# Patient Record
Sex: Female | Born: 1960 | Race: Black or African American | Hispanic: No | Smoking: Former smoker
Health system: Southern US, Community
[De-identification: ages and names within clinical notes are randomized; demographics above are authoritative.]

## PROBLEM LIST (undated history)

## (undated) ENCOUNTER — Ambulatory Visit (HOSPITAL_COMMUNITY): Admission: EM | Payer: Medicaid Other | Source: Home / Self Care

## (undated) DIAGNOSIS — F32A Depression, unspecified: Secondary | ICD-10-CM

## (undated) DIAGNOSIS — F329 Major depressive disorder, single episode, unspecified: Secondary | ICD-10-CM

## (undated) DIAGNOSIS — F419 Anxiety disorder, unspecified: Secondary | ICD-10-CM

## (undated) DIAGNOSIS — F319 Bipolar disorder, unspecified: Secondary | ICD-10-CM

## (undated) DIAGNOSIS — F209 Schizophrenia, unspecified: Secondary | ICD-10-CM

## (undated) HISTORY — PX: HERNIA REPAIR: SHX51

---

## 1998-01-12 ENCOUNTER — Encounter: Admission: RE | Admit: 1998-01-12 | Discharge: 1998-01-12 | Payer: Self-pay | Admitting: Family Medicine

## 1998-01-26 ENCOUNTER — Encounter: Admission: RE | Admit: 1998-01-26 | Discharge: 1998-01-26 | Payer: Self-pay | Admitting: Family Medicine

## 2001-02-16 ENCOUNTER — Encounter: Admission: RE | Admit: 2001-02-16 | Discharge: 2001-02-16 | Payer: Self-pay | Admitting: Family Medicine

## 2001-03-30 ENCOUNTER — Encounter: Admission: RE | Admit: 2001-03-30 | Discharge: 2001-03-30 | Payer: Self-pay | Admitting: Family Medicine

## 2001-12-07 ENCOUNTER — Encounter (INDEPENDENT_AMBULATORY_CARE_PROVIDER_SITE_OTHER): Payer: Self-pay | Admitting: *Deleted

## 2001-12-07 LAB — CONVERTED CEMR LAB

## 2002-01-03 ENCOUNTER — Encounter: Admission: RE | Admit: 2002-01-03 | Discharge: 2002-01-03 | Payer: Self-pay | Admitting: Family Medicine

## 2002-01-03 ENCOUNTER — Other Ambulatory Visit: Admission: RE | Admit: 2002-01-03 | Discharge: 2002-01-03 | Payer: Self-pay | Admitting: Family Medicine

## 2002-01-10 ENCOUNTER — Encounter: Admission: RE | Admit: 2002-01-10 | Discharge: 2002-01-10 | Payer: Self-pay | Admitting: Family Medicine

## 2006-08-07 ENCOUNTER — Encounter (INDEPENDENT_AMBULATORY_CARE_PROVIDER_SITE_OTHER): Payer: Self-pay | Admitting: *Deleted

## 2012-04-09 ENCOUNTER — Ambulatory Visit (INDEPENDENT_AMBULATORY_CARE_PROVIDER_SITE_OTHER): Payer: Self-pay | Admitting: Family Medicine

## 2012-04-09 ENCOUNTER — Encounter: Payer: Self-pay | Admitting: Family Medicine

## 2012-04-09 VITALS — BP 122/76 | HR 69 | Ht 60.0 in | Wt 157.0 lb

## 2012-04-09 DIAGNOSIS — M170 Bilateral primary osteoarthritis of knee: Secondary | ICD-10-CM | POA: Insufficient documentation

## 2012-04-09 DIAGNOSIS — M545 Low back pain: Secondary | ICD-10-CM | POA: Insufficient documentation

## 2012-04-09 DIAGNOSIS — M25569 Pain in unspecified knee: Secondary | ICD-10-CM

## 2012-04-09 LAB — BASIC METABOLIC PANEL
BUN: 15 mg/dL (ref 6–23)
Calcium: 9.5 mg/dL (ref 8.4–10.5)
Chloride: 105 mEq/L (ref 96–112)
Creat: 0.95 mg/dL (ref 0.50–1.10)

## 2012-04-09 MED ORDER — MELOXICAM 15 MG PO TABS: 15.0000 mg | ORAL_TABLET | Freq: Every day | ORAL | Status: DC

## 2012-04-09 NOTE — Assessment & Plan Note (Addendum)
Mobic printed per pt request. Check BMET to ensure adequate renal function since no labs in the system for this patient.  Would get xrays if pt qualifies for medicaid or orange card. Likely this is arthritis. No abnormalities on exam; diffuse tenderness more c/w something like fibromyalgia. Would want xray before injecting.  Could also consider referral to [facility] for U/S.

## 2012-04-09 NOTE — Patient Instructions (Addendum)
It was nice to meet you today.  I am starting a medicine to help with the pain.  We are checking a lab to make sure you kidneys are ok to start this medicine.  CALL AND MAKE AN APPOINTMENT TO SEE BARBARA ABOUT THE ORANGE CARD.  Come back to get re-established with your new PCP.  Make sure you bring ALL medicine bottles with you to that appointment.  It would also be helpful to have your mental health doctor send [place] your most recent office visit.    Low Back Sprain with Rehab  A sprain is an injury in which a ligament is torn. The ligaments of the lower back are vulnerable to sprains. However, they are strong and require great force to be injured. These ligaments are important for stabilizing the spinal column. Sprains are classified into three categories. Grade 1 sprains cause pain, but the tendon is not lengthened. Grade 2 sprains include a lengthened ligament, due to the ligament being stretched or partially ruptured. With grade 2 sprains there is still function, although the function may be decreased. Grade 3 sprains involve a complete tear of the tendon or muscle, and function is usually impaired. SYMPTOMS   Severe pain in the lower back.  Sometimes, a feeling of a "pop," "snap," or tear, at the time of injury.  Tenderness and sometimes swelling at the injury site.  Uncommonly, bruising (contusion) within 48 hours of injury.  Muscle spasms in the back. CAUSES  Low back sprains occur when a force is placed on the ligaments that is greater than they can handle. Common causes of injury include:  Performing a stressful act while off-balance.  Repetitive stressful activities that involve movement of the lower back.  Direct hit (trauma) to the lower back. RISK INCREASES WITH:  Contact sports (football, wrestling).  Collisions (major skiing accidents).  Sports that require throwing or lifting (baseball, weightlifting).  Sports involving twisting of the spine (gymnastics, diving,  tennis, golf).  Poor strength and flexibility.  Inadequate protection.  Previous back injury or surgery (especially fusion). PREVENTION  Wear properly fitted and padded protective equipment.  Warm up and stretch properly before activity.  Allow for adequate recovery between workouts.  Maintain physical fitness:  Strength, flexibility, and endurance.  Cardiovascular fitness.  Maintain a healthy body weight. PROGNOSIS  If treated properly, low back sprains usually heal with non-surgical treatment. The length of time for healing depends on the severity of the injury.  RELATED COMPLICATIONS   Recurring symptoms, resulting in a chronic problem.  Chronic inflammation and pain in the low back.  Delayed healing or resolution of symptoms, especially if activity is resumed too soon.  Prolonged impairment.  Unstable or arthritic joints of the low back. TREATMENT  Treatment first involves the use of ice and medicine, to reduce pain and inflammation. The use of strengthening and stretching exercises may help reduce pain with activity. These exercises may be performed at home or with a therapist. Severe injuries may require referral to a therapist for further evaluation and treatment, such as ultrasound. Your caregiver may advise that you wear a back brace or corset, to help reduce pain and discomfort. Often, prolonged bed rest results in greater harm then benefit. Corticosteroid injections may be recommended. However, these should be reserved for the most serious cases. It is important to avoid using your back when lifting objects. At night, sleep on your back on a firm mattress, with a pillow placed under your knees. If non-surgical treatment is  unsuccessful, surgery may be needed.  MEDICATION   If pain medicine is needed, nonsteroidal anti-inflammatory medicines (aspirin and ibuprofen), or other minor pain relievers (acetaminophen), are often advised.  Do not take pain medicine for 7  days before surgery.  Prescription pain relievers may be given, if your caregiver thinks they are needed. Use only as directed and only as much as you need.  Ointments applied to the skin may be helpful.  Corticosteroid injections may be given by your caregiver. These injections should be reserved for the most serious cases, because they may only be given a certain number of times. HEAT AND COLD  Cold treatment (icing) should be applied for 10 to 15 minutes every 2 to 3 hours for inflammation and pain, and immediately after activity that aggravates your symptoms. Use ice packs or an ice massage.  Heat treatment may be used before performing stretching and strengthening activities prescribed by your caregiver, physical therapist, or athletic trainer. Use a heat pack or a warm water soak. SEEK MEDICAL CARE IF:   Symptoms get worse or do not improve in 2 to 4 weeks, despite treatment.  You develop numbness or weakness in either leg.  You lose bowel or bladder function.  Any of the following occur after surgery: fever, increased pain, swelling, redness, drainage of fluids, or bleeding in the affected area.  New, unexplained symptoms develop. (Drugs used in treatment may produce side effects.) EXERCISES  RANGE OF MOTION (ROM) AND STRETCHING EXERCISES - Low Back Sprain Most people with lower back pain will find that their symptoms get worse with excessive bending forward (flexion) or arching at the lower back (extension). The exercises that will help resolve your symptoms will focus on the opposite motion.  Your physician, physical therapist or athletic trainer will help you determine which exercises will be most helpful to resolve your lower back pain. Do not complete any exercises without first consulting with your caregiver. Discontinue any exercises which make your symptoms worse, until you speak to your caregiver. If you have pain, numbness or tingling which travels down into your buttocks,  leg or foot, the goal of the therapy is for these symptoms to move closer to your back and eventually resolve. Sometimes, these leg symptoms will get better, but your lower back pain may worsen. This is often an indication of progress in your rehabilitation. Be very alert to any changes in your symptoms and the activities in which you participated in the 24 hours prior to the change. Sharing this information with your caregiver will allow him or her to most efficiently treat your condition. These exercises may help you when beginning to rehabilitate your injury. Your symptoms may resolve with or without further involvement from your physician, physical therapist or athletic trainer. While completing these exercises, remember:   Restoring tissue flexibility helps normal motion to return to the joints. This allows healthier, less painful movement and activity.  An effective stretch should be held for at least 30 seconds.  A stretch should never be painful. You should only feel a gentle lengthening or release in the stretched tissue. FLEXION RANGE OF MOTION AND STRETCHING EXERCISES: STRETCH  Flexion, Single Knee to Chest   Lie on a firm bed or floor with both legs extended in front of you.  Keeping one leg in contact with the floor, bring your opposite knee to your chest. Hold your leg in place by either grabbing behind your thigh or at your knee.  Pull until you feel a  gentle stretch in your low back. Hold __________ seconds.  Slowly release your grasp and repeat the exercise with the opposite side. Repeat __________ times. Complete this exercise __________ times per day.  STRETCH  Flexion, Double Knee to Chest  Lie on a firm bed or floor with both legs extended in front of you.  Keeping one leg in contact with the floor, bring your opposite knee to your chest.  Tense your stomach muscles to support your back and then lift your other knee to your chest. Hold your legs in place by either  grabbing behind your thighs or at your knees.  Pull both knees toward your chest until you feel a gentle stretch in your low back. Hold __________ seconds.  Tense your stomach muscles and slowly return one leg at a time to the floor. Repeat __________ times. Complete this exercise __________ times per day.  STRETCH  Low Trunk Rotation  Lie on a firm bed or floor. Keeping your legs in front of you, bend your knees so they are both pointed toward the ceiling and your feet are flat on the floor.  Extend your arms out to the side. This will stabilize your upper body by keeping your shoulders in contact with the floor.  Gently and slowly drop both knees together to one side until you feel a gentle stretch in your low back. Hold for __________ seconds.  Tense your stomach muscles to support your lower back as you bring your knees back to the starting position. Repeat the exercise to the other side. Repeat __________ times. Complete this exercise __________ times per day  EXTENSION RANGE OF MOTION AND FLEXIBILITY EXERCISES: STRETCH  Extension, Prone on Elbows   Lie on your stomach on the floor, a bed will be too soft. Place your palms about shoulder width apart and at the height of your head.  Place your elbows under your shoulders. If this is too painful, stack pillows under your chest.  Allow your body to relax so that your hips drop lower and make contact more completely with the floor.  Hold this position for __________ seconds.  Slowly return to lying flat on the floor. Repeat __________ times. Complete this exercise __________ times per day.  RANGE OF MOTION  Extension, Prone Press Ups  Lie on your stomach on the floor, a bed will be too soft. Place your palms about shoulder width apart and at the height of your head.  Keeping your back as relaxed as possible, slowly straighten your elbows while keeping your hips on the floor. You may adjust the placement of your hands to maximize  your comfort. As you gain motion, your hands will come more underneath your shoulders.  Hold this position __________ seconds.  Slowly return to lying flat on the floor. Repeat __________ times. Complete this exercise __________ times per day.  RANGE OF MOTION- Quadruped, Neutral Spine   Assume a hands and knees position on a firm surface. Keep your hands under your shoulders and your knees under your hips. You may place padding under your knees for comfort.  Drop your head and point your tailbone toward the ground below you. This will round out your lower back like an angry cat. Hold this position for __________ seconds.  Slowly lift your head and release your tail bone so that your back sags into a large arch, like an old horse.  Hold this position for __________ seconds.  Repeat this until you feel limber in your low back.  Now, find your "sweet spot." This will be the most comfortable position somewhere between the two previous positions. This is your neutral spine. Once you have found this position, tense your stomach muscles to support your low back.  Hold this position for __________ seconds. Repeat __________ times. Complete this exercise __________ times per day.  STRENGTHENING EXERCISES - Low Back Sprain These exercises may help you when beginning to rehabilitate your injury. These exercises should be done near your "sweet spot." This is the neutral, low-back arch, somewhere between fully rounded and fully arched, that is your least painful position. When performed in this safe range of motion, these exercises can be used for people who have either a flexion or extension based injury. These exercises may resolve your symptoms with or without further involvement from your physician, physical therapist or athletic trainer. While completing these exercises, remember:   Muscles can gain both the endurance and the strength needed for everyday activities through controlled  exercises.  Complete these exercises as instructed by your physician, physical therapist or athletic trainer. Increase the resistance and repetitions only as guided.  You may experience muscle soreness or fatigue, but the pain or discomfort you are trying to eliminate should never worsen during these exercises. If this pain does worsen, stop and make certain you are following the directions exactly. If the pain is still present after adjustments, discontinue the exercise until you can discuss the trouble with your caregiver. STRENGTHENING Deep Abdominals, Pelvic Tilt   Lie on a firm bed or floor. Keeping your legs in front of you, bend your knees so they are both pointed toward the ceiling and your feet are flat on the floor.  Tense your lower abdominal muscles to press your low back into the floor. This motion will rotate your pelvis so that your tail bone is scooping upwards rather than pointing at your feet or into the floor. With a gentle tension and even breathing, hold this position for __________ seconds. Repeat __________ times. Complete this exercise __________ times per day.  STRENGTHENING  Abdominals, Crunches   Lie on a firm bed or floor. Keeping your legs in front of you, bend your knees so they are both pointed toward the ceiling and your feet are flat on the floor. Cross your arms over your chest.  Slightly tip your chin down without bending your neck.  Tense your abdominals and slowly lift your trunk high enough to just clear your shoulder blades. Lifting higher can put excessive stress on the lower back and does not further strengthen your abdominal muscles.  Control your return to the starting position. Repeat __________ times. Complete this exercise __________ times per day.  STRENGTHENING  Quadruped, Opposite UE/LE Lift   Assume a hands and knees position on a firm surface. Keep your hands under your shoulders and your knees under your hips. You may place padding under  your knees for comfort.  Find your neutral spine and gently tense your abdominal muscles so that you can maintain this position. Your shoulders and hips should form a rectangle that is parallel with the floor and is not twisted.  Keeping your trunk steady, lift your right hand no higher than your shoulder and then your left leg no higher than your hip. Make sure you are not holding your breath. Hold this position for __________ seconds.  Continuing to keep your abdominal muscles tense and your back steady, slowly return to your starting position. Repeat with the opposite arm and leg. Repeat  __________ times. Complete this exercise __________ times per day.  STRENGTHENING  Abdominals and Quadriceps, Straight Leg Raise   Lie on a firm bed or floor with both legs extended in front of you.  Keeping one leg in contact with the floor, bend the other knee so that your foot can rest flat on the floor.  Find your neutral spine, and tense your abdominal muscles to maintain your spinal position throughout the exercise.  Slowly lift your straight leg off the floor about 6 inches for a count of 15, making sure to not hold your breath.  Still keeping your neutral spine, slowly lower your leg all the way to the floor. Repeat this exercise with each leg __________ times. Complete this exercise __________ times per day. POSTURE AND BODY MECHANICS CONSIDERATIONS - Low Back Sprain Keeping correct posture when sitting, standing or completing your activities will reduce the stress put on different body tissues, allowing injured tissues a chance to heal and limiting painful experiences. The following are general guidelines for improved posture. Your physician or physical therapist will provide you with any instructions specific to your needs. While reading these guidelines, remember:  The exercises prescribed by your provider will help you have the flexibility and strength to maintain correct postures.  The  correct posture provides the best environment for your joints to work. All of your joints have less wear and tear when properly supported by a spine with good posture. This means you will experience a healthier, less painful body.  Correct posture must be practiced with all of your activities, especially prolonged sitting and standing. Correct posture is as important when doing repetitive low-stress activities (typing) as it is when doing a single heavy-load activity (lifting). RESTING POSITIONS Consider which positions are most painful for you when choosing a resting position. If you have pain with flexion-based activities (sitting, bending, stooping, squatting), choose a position that allows you to rest in a less flexed posture. You would want to avoid curling into a fetal position on your side. If your pain worsens with extension-based activities (prolonged standing, working overhead), avoid resting in an extended position such as sleeping on your stomach. Most people will find more comfort when they rest with their spine in a more neutral position, neither too rounded nor too arched. Lying on a non-sagging bed on your side with a pillow between your knees, or on your back with a pillow under your knees will often provide some relief. Keep in mind, being in any one position for a prolonged period of time, no matter how correct your posture, can still lead to stiffness. PROPER SITTING POSTURE In order to minimize stress and discomfort on your spine, you must sit with correct posture. Sitting with good posture should be effortless for a healthy body. Returning to good posture is a gradual process. Many people can work toward this most comfortably by using various supports until they have the flexibility and strength to maintain this posture on their own. When sitting with proper posture, your ears will fall over your shoulders and your shoulders will fall over your hips. You should use the back of the chair  to support your upper back. Your lower back will be in a neutral position, just slightly arched. You may place a small pillow or folded towel at the base of your lower back for  support.  When working at a desk, create an environment that supports good, upright posture. Without extra support, muscles tire, which leads to excessive  strain on joints and other tissues. Keep these recommendations in mind: CHAIR:  A chair should be able to slide under your desk when your back makes contact with the back of the chair. This allows you to work closely.  The chair's height should allow your eyes to be level with the upper part of your monitor and your hands to be slightly lower than your elbows. BODY POSITION  Your feet should make contact with the floor. If this is not possible, use a foot rest.  Keep your ears over your shoulders. This will reduce stress on your neck and low back. INCORRECT SITTING POSTURES  If you are feeling tired and unable to assume a healthy sitting posture, do not slouch or slump. This puts excessive strain on your back tissues, causing more damage and pain. Healthier options include:  Using more support, like a lumbar pillow.  Switching tasks to something that requires you to be upright or walking.  Talking a brief walk.  Lying down to rest in a neutral-spine position. PROLONGED STANDING WHILE SLIGHTLY LEANING FORWARD  When completing a task that requires you to lean forward while standing in one place for a long time, place either foot up on a stationary 2-4 inch high object to help maintain the best posture. When both feet are on the ground, the lower back tends to lose its slight inward curve. If this curve flattens (or becomes too large), then the back and your other joints will experience too much stress, tire more quickly, and can cause pain. CORRECT STANDING POSTURES Proper standing posture should be assumed with all daily activities, even if they only take a few  moments, like when brushing your teeth. As in sitting, your ears should fall over your shoulders and your shoulders should fall over your hips. You should keep a slight tension in your abdominal muscles to brace your spine. Your tailbone should point down to the ground, not behind your body, resulting in an over-extended swayback posture.  INCORRECT STANDING POSTURES  Common incorrect standing postures include a forward head, locked knees and/or an excessive swayback. WALKING Walk with an upright posture. Your ears, shoulders and hips should all line-up. PROLONGED ACTIVITY IN A FLEXED POSITION When completing a task that requires you to bend forward at your waist or lean over a low surface, try to find a way to stabilize 3 out of 4 of your limbs. You can place a hand or elbow on your thigh or rest a knee on the surface you are reaching across. This will provide you more stability, so that your muscles do not tire as quickly. By keeping your knees relaxed, or slightly bent, you will also reduce stress across your lower back. CORRECT LIFTING TECHNIQUES DO :  Assume a wide stance. This will provide you more stability and the opportunity to get as close as possible to the object which you are lifting.  Tense your abdominals to brace your spine. Bend at the knees and hips. Keeping your back locked in a neutral-spine position, lift using your leg muscles. Lift with your legs, keeping your back straight.  Test the weight of unknown objects before attempting to lift them.  Try to keep your elbows locked down at your sides in order get the best strength from your shoulders when carrying an object.  Always ask for help when lifting heavy or awkward objects. INCORRECT LIFTING TECHNIQUES DO NOT:   Lock your knees when lifting, even if it is a small object.  Bend and twist. Pivot at your feet or move your feet when needing to change directions.  Assume that you can safely pick up even a paperclip  without proper posture. Document Released: 05/26/2005 Document Revised: 08/18/2011 Document Reviewed: 09/07/2008 [facility] Patient Information 2013 [place], [place].
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
## 2012-04-09 NOTE — Assessment & Plan Note (Signed)
Mobic, strengthening exercises. Consider PT if pt qualifies for medicaid or orange card. No red flags or neuro symptoms on history or exam.

## 2012-04-11 ENCOUNTER — Encounter: Payer: Self-pay | Admitting: Family Medicine

## 2012-05-25 ENCOUNTER — Emergency Department (HOSPITAL_COMMUNITY)
Admission: EM | Admit: 2012-05-25 | Discharge: 2012-05-25 | Disposition: A | Discharge status: Home / Self Care | Payer: Self-pay | Attending: Emergency Medicine | Admitting: Emergency Medicine

## 2012-05-25 ENCOUNTER — Encounter (HOSPITAL_COMMUNITY): Payer: Self-pay | Admitting: *Deleted

## 2012-05-25 DIAGNOSIS — M545 Low back pain, unspecified: Secondary | ICD-10-CM | POA: Insufficient documentation

## 2012-05-25 DIAGNOSIS — F3289 Other specified depressive episodes: Secondary | ICD-10-CM | POA: Insufficient documentation

## 2012-05-25 DIAGNOSIS — Z8659 Personal history of other mental and behavioral disorders: Secondary | ICD-10-CM | POA: Insufficient documentation

## 2012-05-25 DIAGNOSIS — Z79899 Other long term (current) drug therapy: Secondary | ICD-10-CM | POA: Insufficient documentation

## 2012-05-25 DIAGNOSIS — M549 Dorsalgia, unspecified: Secondary | ICD-10-CM

## 2012-05-25 DIAGNOSIS — F209 Schizophrenia, unspecified: Secondary | ICD-10-CM | POA: Insufficient documentation

## 2012-05-25 DIAGNOSIS — M25569 Pain in unspecified knee: Secondary | ICD-10-CM | POA: Insufficient documentation

## 2012-05-25 DIAGNOSIS — F329 Major depressive disorder, single episode, unspecified: Secondary | ICD-10-CM | POA: Insufficient documentation

## 2012-05-25 DIAGNOSIS — F319 Bipolar disorder, unspecified: Secondary | ICD-10-CM | POA: Insufficient documentation

## 2012-05-25 HISTORY — DX: Bipolar disorder, unspecified: F31.9

## 2012-05-25 HISTORY — DX: Anxiety disorder, unspecified: F41.9

## 2012-05-25 HISTORY — DX: Major depressive disorder, single episode, unspecified: F32.9

## 2012-05-25 HISTORY — DX: Depression, unspecified: F32.A

## 2012-05-25 HISTORY — DX: Schizophrenia, unspecified: F20.9

## 2012-05-25 MED ORDER — MELOXICAM 7.5 MG PO TABS: 15.0000 mg | ORAL_TABLET | Freq: Every day | ORAL | Status: DC

## 2012-05-25 NOTE — ED Notes (Signed)
Pt reports chronic back and bilateral knee pain. Is a pt with Family Practice. Ran out of her pain medicine and needs a refill.

## 2012-05-25 NOTE — ED Notes (Signed)
Called pt 3 times for a room in [facility], pt did not show up.

## 2012-05-25 NOTE — ED Notes (Signed)
Patient states she woke up today with lower back pain and bil knee pain. She states both legs are giving out on her.  She denies fall

## 2012-05-25 NOTE — ED Provider Notes (Signed)
History    This chart was scribed for Geoffery Lyons, MD, MD by Smitty Pluck, ED Scribe. The patient was seen in room TR07C and the patient's care was started at 4:20PM.   CSN: [id number]  Arrival date & time 05/25/12  1512      Chief Complaint  Patient presents with  . Back Pain  . Knee Pain    (Consider location/radiation/quality/duration/timing/severity/associated sxs/prior treatment) The history is provided by the patient. No language interpreter was used.   Ruth Gardner is a 51 y.o. female who presents to the Emergency Department complaining of chronic moderate back and moderate bilateral knee pain onset 09/2011 with symptoms worsening 1 day ago. Pt reports that symptoms have been constant since last night. Pt reports that she was seen at [facility] in November and was given meloxicam but she ran out of the medication 15 days ago. She states the meloxicam relieved pain. Pt denies dysuria, bowel incontinence, urinary incontinence, numbness, weakness, recent trauma, fall and any other symptoms.   Past Medical History  Diagnosis Date  . Depression   . Anxiety   . Bipolar 1 disorder   . Schizophrenia     Past Surgical History  Procedure Date  . Hernia repair     No family history on file.  History  Substance Use Topics  . Smoking status: Never Smoker   . Smokeless tobacco: Not on file  . Alcohol Use: No    OB History    Grav Para Term Preterm Abortions TAB SAB Ect Mult Living                  Review of Systems  Constitutional: Negative for fever and chills.  Respiratory: Negative for shortness of breath.   Gastrointestinal: Negative for nausea and vomiting.  Musculoskeletal: Positive for back pain and arthralgias.  Neurological: Negative for weakness.  All other systems reviewed and are negative.    Allergies  Review of patient's allergies indicates no known allergies.  Home Medications   Current Outpatient Rx  Name  Route  Sig   Dispense  Refill  . FLUOXETINE HCL 20 MG PO CAPS   Oral   Take 40 mg by mouth daily.         . MELOXICAM 15 MG PO TABS   Oral   Take 1 tablet (15 mg total) by mouth daily.   30 tablet   1   . QUETIAPINE FUMARATE 100 MG PO TABS   Oral   Take 50 mg by mouth 2 (two) times daily.         Marland Kitchen RISPERIDONE 1 MG PO TABS   Oral   Take 1 mg by mouth at bedtime.         . TRAZODONE HCL 100 MG PO TABS   Oral   Take 100 mg by mouth at bedtime as needed. For insomnia           BP 111/67  Pulse 62  Temp 98.4 F (36.9 C) (Oral)  Resp 20  Ht 5' (1.524 m)  Wt 155 lb (70.308 kg)  BMI 30.27 kg/m2  SpO2 99%  Physical Exam  Nursing note and vitals reviewed. Constitutional: She is oriented to person, place, and time. She appears well-developed and well-nourished. No distress.  HENT:  Head: Normocephalic and atraumatic.  Eyes: EOM are normal.  Neck: Neck supple. No tracheal deviation present.  Cardiovascular: Normal rate.   Pulmonary/Chest: Effort normal. No respiratory distress.  Musculoskeletal: Normal range of  motion.       Tender to palpation in soft tissues of lumbar spine No bony tenderness No step off Pt is able to ambulate without difficulty    Neurological: She is alert and oriented to person, place, and time.       Strength 5/5  DTR +1 and equal   Skin: Skin is warm and dry.  Psychiatric: She has a normal mood and affect. Her behavior is normal.    ED Course  Procedures (including critical care time) DIAGNOSTIC STUDIES: Oxygen Saturation is 99% on room air, normal by my interpretation.    COORDINATION OF CARE: 4:25 PM Discussed ED treatment with pt     Labs Reviewed - No data to display No results found.   No diagnosis found.    MDM  Increasing pain since running out of meloxicam.  Will prescribe this for her.  Needs to see pcp for follow up.      I personally performed the services described in this documentation, which was scribed in my  presence. The recorded information has been reviewed and is accurate.      Geoffery Lyons, MD 05/25/12 [phone]

## 2014-01-02 ENCOUNTER — Other Ambulatory Visit: Payer: Self-pay | Admitting: Family Medicine

## 2014-01-02 DIAGNOSIS — Z1231 Encounter for screening mammogram for malignant neoplasm of breast: Secondary | ICD-10-CM

## 2014-01-06 ENCOUNTER — Ambulatory Visit (HOSPITAL_COMMUNITY)
Admission: RE | Admit: 2014-01-06 | Discharge: 2014-01-06 | Disposition: A | Discharge status: Home / Self Care | Payer: Medicaid Other | Source: Ambulatory Visit | Attending: Family Medicine | Admitting: Family Medicine

## 2014-01-06 ENCOUNTER — Ambulatory Visit (HOSPITAL_COMMUNITY): Payer: Self-pay

## 2014-01-06 DIAGNOSIS — Z1231 Encounter for screening mammogram for malignant neoplasm of breast: Secondary | ICD-10-CM | POA: Diagnosis present

## 2014-03-07 ENCOUNTER — Ambulatory Visit: Payer: Medicaid Other | Admitting: Family Medicine

## 2014-03-27 ENCOUNTER — Ambulatory Visit (INDEPENDENT_AMBULATORY_CARE_PROVIDER_SITE_OTHER): Payer: Medicaid Other | Admitting: Family Medicine

## 2014-03-27 ENCOUNTER — Ambulatory Visit (HOSPITAL_COMMUNITY)
Admission: RE | Admit: 2014-03-27 | Discharge: 2014-03-27 | Disposition: A | Discharge status: Home / Self Care | Payer: Medicaid Other | Source: Ambulatory Visit | Attending: Family Medicine | Admitting: Family Medicine

## 2014-03-27 ENCOUNTER — Other Ambulatory Visit: Payer: Self-pay | Admitting: Family Medicine

## 2014-03-27 ENCOUNTER — Encounter: Payer: Self-pay | Admitting: Family Medicine

## 2014-03-27 VITALS — BP 114/68 | HR 64 | Temp 97.8°F | Ht 64.0 in | Wt 154.3 lb

## 2014-03-27 DIAGNOSIS — G8929 Other chronic pain: Secondary | ICD-10-CM | POA: Insufficient documentation

## 2014-03-27 DIAGNOSIS — H538 Other visual disturbances: Secondary | ICD-10-CM

## 2014-03-27 DIAGNOSIS — M25562 Pain in left knee: Secondary | ICD-10-CM

## 2014-03-27 DIAGNOSIS — R079 Chest pain, unspecified: Secondary | ICD-10-CM

## 2014-03-27 DIAGNOSIS — M5442 Lumbago with sciatica, left side: Secondary | ICD-10-CM

## 2014-03-27 DIAGNOSIS — F39 Unspecified mood [affective] disorder: Secondary | ICD-10-CM | POA: Insufficient documentation

## 2014-03-27 LAB — CBC
HCT: 38 % (ref 36.0–46.0)
Hemoglobin: 12.7 g/dL (ref 12.0–15.0)
MCH: 27.9 pg (ref 26.0–34.0)
MCHC: 33.4 g/dL (ref 30.0–36.0)
MCV: 83.3 fL (ref 78.0–100.0)
Platelets: 367 10*3/uL (ref 150–400)
RBC: 4.56 MIL/uL (ref 3.87–5.11)
RDW: 13.8 % (ref 11.5–15.5)
WBC: 7.6 10*3/uL (ref 4.0–10.5)

## 2014-03-27 LAB — LIPID PANEL
Cholesterol: 226 mg/dL — ABNORMAL HIGH (ref 0–200)
HDL: 35 mg/dL — ABNORMAL LOW (ref >39)
LDL Cholesterol: 154 mg/dL — ABNORMAL HIGH (ref 0–99)
Total CHOL/HDL Ratio: 6.5 Ratio
Triglycerides: 187 mg/dL — ABNORMAL HIGH (ref <150)
VLDL: 37 mg/dL (ref 0–40)

## 2014-03-27 LAB — COMPREHENSIVE METABOLIC PANEL
ALBUMIN: 4.2 g/dL (ref 3.5–5.2)
ALK PHOS: 75 U/L (ref 39–117)
ALT: 11 U/L (ref 0–35)
AST: 16 U/L (ref 0–37)
BUN: 10 mg/dL (ref 6–23)
CHLORIDE: 105 meq/L (ref 96–112)
CO2: 27 mEq/L (ref 19–32)
Calcium: 9.6 mg/dL (ref 8.4–10.5)
Creat: 0.88 mg/dL (ref 0.50–1.10)
GLUCOSE: 88 mg/dL (ref 70–99)
Potassium: 4.6 mEq/L (ref 3.5–5.3)
Sodium: 140 mEq/L (ref 135–145)
Total Bilirubin: 0.3 mg/dL (ref 0.2–1.2)
Total Protein: 7 g/dL (ref 6.0–8.3)

## 2014-03-27 LAB — TSH: TSH: 1.541 u[IU]/mL (ref 0.350–4.500)

## 2014-03-27 MED ORDER — RISPERIDONE 2 MG PO TABS: 4.0000 mg | ORAL_TABLET | Freq: Every day | ORAL | Status: AC

## 2014-03-27 MED ORDER — NAPROXEN 375 MG PO TABS: 375.0000 mg | ORAL_TABLET | Freq: Two times a day (BID) | ORAL | Status: DC

## 2014-03-27 NOTE — Assessment & Plan Note (Signed)
Typical with atypical features None today Labs Refer to cards for stress test

## 2014-03-27 NOTE — Assessment & Plan Note (Signed)
Likely OA vs meniscus injury in the past Films NSAIDs Follow up 1 month

## 2014-03-27 NOTE — Assessment & Plan Note (Signed)
Labs, 20/40 BL with no red flags Encourage optometry

## 2014-03-27 NOTE — Assessment & Plan Note (Signed)
Next mood disorder [facility] On Risperdal, Prozac, trazodone Odd Affect today, denies visual or auditory hallucinations and suicidal ideation.

## 2014-03-27 NOTE — Patient Instructions (Signed)
Great to meet you  I have referred you to the heart doctors for your chest pain I will contact you about your labs  Chest Pain (Nonspecific) It is often hard to give a diagnosis for the cause of chest pain. There is always a chance that your pain could be related to something serious, such as a heart attack or a blood clot in the lungs. You need to follow up with your doctor. HOME CARE  If antibiotic medicine was given, take it as directed by your doctor. Finish the medicine even if you start to feel better.  For the next few days, avoid activities that bring on chest pain. Continue physical activities as told by your doctor.  Do not use any tobacco products. This includes cigarettes, chewing tobacco, and e-cigarettes.  Avoid drinking alcohol.  Only take medicine as told by your doctor.  Follow your doctor's suggestions for more testing if your chest pain does not go away.  Keep all doctor visits you made. GET HELP IF:  Your chest pain does not go away, even after treatment.  You have a rash with blisters on your chest.  You have a fever. GET HELP RIGHT AWAY IF:   You have more pain or pain that spreads to your arm, neck, jaw, back, or belly (abdomen).  You have shortness of breath.  You cough more than usual or cough up blood.  You have very bad back or belly pain.  You feel sick to your stomach (nauseous) or throw up (vomit).  You have very bad weakness.  You pass out (faint).  You have chills. This is an emergency. Do not wait to see if the problems will go away. Call your local emergency services (911 in [place] Do not drive yourself to the hospital. MAKE SURE YOU:   Understand these instructions.  Will watch your condition.  Will get help right away if you are not doing well or get worse. Document Released: 11/12/2007 Document Revised: 05/31/2013 Document Reviewed: 11/12/2007 [facility] Patient Information 2015 [employer] [place]. This information is not intended  to replace advice given to you by your health care provider. Make sure you discuss any questions you have with your health care provider.

## 2014-03-27 NOTE — Progress Notes (Signed)
Patient ID: Ruth Gardner, female   DOB: 04/29/1961, 53 y.o.   MRN: [id number]   HPI  Patient presents today for followup knee pain  Knee pain States that she's had progressive worsening bilateral knee pain, but left greater than right, for the last several years.  She denies any discrete injury and states that the pain is worse when walking or walking up stairs. She [facility]. She denies any swelling, erythema, or warmth of the knees  Chest pain She describes right-sided pressure-like chest pain worse with walking and improves with slowing down. She has associated dyspnea these times. She does sometimes have similar pain at rest. She states that the pain ranges from lasting several minutes to about 30 minutes. She denies any associated nausea or radiation.  Back pain States she has left-sided back pain with radiation down the back of her leg intermittently. She states that the knee pain is much worse than the back pain. As previously help with NSAIDs She denies bowel or bladder dysfunction, saddle anesthesia, or lower extremity weakness.  Smoking status noted ROS: Per HPI  Objective: BP 114/68  Pulse 64  Temp(Src) 97.8 F (36.6 C) (Oral)  Ht 5\' 4"  (1.626 m)  Wt 154 lb 4.8 oz (69.99 kg)  BMI 26.47 kg/m2 Gen: NAD, alert, cooperative with exam HEENT: NCAT, EOMI, injected conjunctiva, pupils reactive, no papilledema present, visual fields 20/40 bilateral on the Snellen eye chart CV: RRR, good S1/S2, no murmur Resp: CTABL, no wheezes, non-labored Ext: No edema, warm Neuro: Alert and oriented, No gross deficits MSK: L knee without erythema, effusion, bruising. Left patella with bony prominence at the superior lateral corner, positive patella grind Tenderness on bilateral joint lines ligamentously intact to Lachman's and with varus and valgus stress.  Psych: Odd affect, no SI, no visual or auditory hallucinations   Assessment and  plan:  Blurred vision, bilateral Labs, 20/40 BL with no red flags Encourage optometry  Low back pain No red flags, likely OA NSAIDs Follow up 1 month  Knee pain Likely OA vs meniscus injury in the past Films NSAIDs Follow up 1 month  Chest pain Typical with atypical features None today Labs Refer to cards for stress test  Mood disorder Next mood disorder managed by [facility] On Risperdal, Prozac, trazodone Odd Affect today, denies visual or auditory hallucinations and suicidal ideation.    Orders Placed This Encounter  Procedures  . DG Knee 4 Views W/Patella Left    Standing please    Standing Status: Future     Number of Occurrences:      Standing Expiration Date: 05/28/2015    Order Specific Question:  Reason for Exam (SYMPTOM  OR DIAGNOSIS REQUIRED)    Answer:  likely OA, satnding please    Order Specific Question:  Is the patient pregnant?    Answer:  No    Order Specific Question:  Preferred imaging location?    Answer:  [facility]  . TSH  . Comprehensive metabolic panel  . CBC  . Lipid panel  . Ambulatory referral to Cardiology    Referral Priority:  Routine    Referral Type:  Consultation    Referral Reason:  Specialty Services Required    Requested Specialty:  Cardiology    Number of Visits Requested:  1  . Ambulatory referral to Optometry    Referral Priority:  Routine    Referral Type:  Vision Training and development officer(Optometry)    Referral Reason:  Specialty Services Required  Requested Specialty:  Optometry    Number of Visits Requested:  1    Meds ordered this encounter  Medications  . naproxen (NAPROSYN) 375 MG tablet    Sig: Take 1 tablet (375 mg total) by mouth 2 (two) times daily with a meal.    Dispense:  40 tablet    Refill:  0

## 2014-03-27 NOTE — Assessment & Plan Note (Signed)
No red flags, likely OA NSAIDs Follow up 1 month

## 2014-03-28 ENCOUNTER — Encounter: Payer: Self-pay | Admitting: Family Medicine

## 2014-03-30 ENCOUNTER — Telehealth: Payer: Self-pay | Admitting: Family Medicine

## 2014-03-30 NOTE — Telephone Encounter (Signed)
Called to discuss labs and xray  Labs largely normal except elevated cholesterol. She has a 10 CVD risk of 5.9% placing her in the "consider mod intensity statin" group. Discussed diet changes and will discuss option of statin on follow up.   Knee film does not show severe arthritis.   Encouraged seeing optometry at a dept store for most affordability as medicaid doesn't cover it.   Murtis SinkSam Lenoir Facchini, MD [facility] Resident, PGY-3 03/30/2014, 8:15 AM

## 2014-04-25 ENCOUNTER — Encounter: Payer: Self-pay | Admitting: Internal Medicine

## 2014-04-25 ENCOUNTER — Ambulatory Visit (INDEPENDENT_AMBULATORY_CARE_PROVIDER_SITE_OTHER): Payer: Medicaid Other | Admitting: Internal Medicine

## 2014-04-25 VITALS — BP 113/78 | HR 65 | Ht 60.0 in | Wt 155.1 lb

## 2014-04-25 DIAGNOSIS — R5383 Other fatigue: Secondary | ICD-10-CM

## 2014-04-25 DIAGNOSIS — R0683 Snoring: Secondary | ICD-10-CM

## 2014-04-25 DIAGNOSIS — K219 Gastro-esophageal reflux disease without esophagitis: Secondary | ICD-10-CM

## 2014-04-25 DIAGNOSIS — R0789 Other chest pain: Secondary | ICD-10-CM

## 2014-04-25 DIAGNOSIS — G4719 Other hypersomnia: Secondary | ICD-10-CM

## 2014-04-25 HISTORY — DX: Snoring: R06.83

## 2014-04-25 MED ORDER — OMEPRAZOLE 20 MG PO CPDR: 20.0000 mg | DELAYED_RELEASE_CAPSULE | Freq: Every day | ORAL | Status: DC

## 2014-04-25 NOTE — Progress Notes (Signed)
OFFICE NOTE  Chief Complaint:  Right-sided chest pain, fatigue, poor sleep  Primary Care Physician: Kevin FentonBradshaw, Samuel, MD  HPI:  Ruth Gardner is a 53 year old female with severe anxiety and an unknown mood disorder on Prozac, Risperdal and trazodone. She has no other known medical problems or pre-existing cardiac problems. Recently she's been having some burning chest discomfort on the right side of her chest for about a month. She says her symptoms are worse underneath her right rib cage and right upper abdomen. She occasionally gets some shortness of breath at rest and with exertion. She does get some occasional palpitations. In the office today she was quite anxious and even started crying spontaneously. She is concerned about a heart problem. She says her symptoms are sometimes worse after eating. She denies any exertional left-sided or substernal chest pain. EKG in the office today shows nonspecific T-wave abnormalities. There is no significant family history of heart disease.  PMHx:  Past Medical History  Diagnosis Date  . Depression   . Anxiety   . Bipolar 1 disorder   . Schizophrenia     Past Surgical History  Procedure Laterality Date  . Hernia repair      FAMHx:  History reviewed. No pertinent family history.  No history of CAD in the family - Mom had cancer.  SOCHx:   reports that she has been smoking Cigarettes.  She has a 6.3 pack-year smoking history. She has never used smokeless tobacco. She reports that she does not drink alcohol or use illicit drugs.  ALLERGIES:  No Known Allergies  ROS: A comprehensive review of systems was negative except for: Cardiovascular: positive for fatigue and palpitations Gastrointestinal: positive for reflux symptoms Behavioral/Psych: positive for anxiety and sleep disturbance  HOME MEDS: Current Outpatient Prescriptions  Medication Sig Dispense Refill  . FLUoxetine (PROZAC) 20 MG capsule Take 40 mg by mouth daily.    .  naproxen (NAPROSYN) 375 MG tablet Take 1 tablet (375 mg total) by mouth 2 (two) times daily with a meal. 40 tablet 0  . risperiDONE (RISPERDAL) 2 MG tablet Take 2 tablets (4 mg total) by mouth at bedtime. 60 tablet 0  . traZODone (DESYREL) 100 MG tablet Take 100 mg by mouth at bedtime as needed. For insomnia    . omeprazole (PRILOSEC) 20 MG capsule Take 1 capsule (20 mg total) by mouth daily. 30 capsule 6   No current facility-administered medications for this visit.    LABS/IMAGING: No results found for this or any previous visit (from the past 48 hour(s)). No results found.  VITALS: BP 113/78 mmHg  Pulse 65  Ht 5' (1.524 m)  Wt 155 lb 1.6 oz (70.353 kg)  BMI 30.29 kg/m2  EXAM: General appearance: alert and mild distress Neck: no carotid bruit, no JVD and thyroid not enlarged, symmetric, no tenderness/mass/nodules Lungs: clear to auscultation bilaterally Heart: regular rate and rhythm, S1, S2 normal, no murmur, click, rub or gallop Abdomen: soft, non-tender; bowel sounds normal; no masses,  no organomegaly Extremities: extremities normal, atraumatic, no cyanosis or edema Pulses: 2+ and symmetric Skin: Skin color, texture, turgor normal. No rashes or lesions Neurologic: Grossly normal Psych: Very anxious, tearful  EKG: Normal sinus rhythm at 65, nonspecific T-wave changes  ASSESSMENT: 1. Atypical right-sided chest pain 2. GERD symptoms 3. Significant anxiety 4. Fatigue, hypersomnolence and snoring with witnessed apnea, concerning for OSA  PLAN: 1.   Ruth Gardner is describing atypical right-sided chest pain which is actually more right upper abdominal  quadrant. Her EKG does show nonspecific T-wave changes, however she has almost no cardiac risk factors and her symptoms are very atypical. She does report some symptoms that are worse after eating which do have a burning quality and she reports having trouble swallowing at times and has thrown up suggesting possible reflux. I  would recommend starting on a PPI for a few weeks to see if it improves her symptoms. In addition she has self-reported significant fatigue, snoring, witnessed apnea which is concerning for OSA. Her EPWSS was 21. This is a high risk score for screening of sleep apnea. Some of these symptoms could be due to her significant psychiatric medications however I'm concerned that she is having true apnea. I would recommend a sleep study.  Plan to see her back in a few weeks to see if she's had improvement in her symptoms with treatment for reflux. Ultimately we might consider stress testing if her symptoms do not resolve although she has significant back and knee problems and walks with a cane and therefore would have to have a chemical perfusion study.  Thank you again for the kind referral.  Chrystie NoseKenneth C. Maicie Vanderloop, MD, [facility] Attending Cardiologist [facility]  Jacquelyne Quarry C 04/25/2014, 12:56 PM

## 2014-04-25 NOTE — Patient Instructions (Signed)
Your physician wants you to follow-up after your sleep study.   Please follow up after your sleep study   Please start omeprazole (prilosec) 20mg  once daily for reflux - this has been sent to your pharmacy

## 2014-06-13 ENCOUNTER — Other Ambulatory Visit: Payer: Self-pay | Admitting: *Deleted

## 2014-06-13 DIAGNOSIS — M25562 Pain in left knee: Secondary | ICD-10-CM

## 2014-06-14 MED ORDER — NAPROXEN 375 MG PO TABS: 375.0000 mg | ORAL_TABLET | Freq: Two times a day (BID) | ORAL | Status: DC

## 2014-07-24 ENCOUNTER — Encounter (HOSPITAL_BASED_OUTPATIENT_CLINIC_OR_DEPARTMENT_OTHER): Payer: Medicaid Other

## 2014-08-22 ENCOUNTER — Telehealth: Payer: Self-pay | Admitting: Family Medicine

## 2014-08-22 NOTE — Telephone Encounter (Signed)
Pt says the rx she was given back in January for her back and knee pain is not helping, she is scheduled next Wednesday for f/u but wanted to know if there is anything else she can do in the meantime

## 2014-08-22 NOTE — Telephone Encounter (Signed)
Patient has not been seen since Oct 2015. There are no stronger meds I can prescribe by phone.   Will ask her to wait for appt or go to urgent care if needs help sooner. WIll ask nursing to convey.   Murtis SinkSam Rahmah Mccamy, MD [facility] Resident, PGY-3 08/22/2014, 12:25 PM

## 2014-08-25 NOTE — Telephone Encounter (Signed)
Pt is going to wait until her appt on Wednesday. Ruth Gardner Ruth Gardner, CMA

## 2014-08-30 ENCOUNTER — Ambulatory Visit: Payer: Medicaid Other | Admitting: Family Medicine

## 2014-08-31 ENCOUNTER — Ambulatory Visit (INDEPENDENT_AMBULATORY_CARE_PROVIDER_SITE_OTHER): Payer: Medicaid Other | Admitting: Family Medicine

## 2014-08-31 ENCOUNTER — Encounter: Payer: Self-pay | Admitting: Family Medicine

## 2014-08-31 VITALS — BP 121/71 | HR 71 | Temp 98.2°F | Ht 60.0 in | Wt 149.6 lb

## 2014-08-31 DIAGNOSIS — F39 Unspecified mood [affective] disorder: Secondary | ICD-10-CM

## 2014-08-31 DIAGNOSIS — G629 Polyneuropathy, unspecified: Secondary | ICD-10-CM | POA: Insufficient documentation

## 2014-08-31 LAB — POCT GLYCOSYLATED HEMOGLOBIN (HGB A1C): Hemoglobin A1C: 5.5
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
## 2014-08-31 NOTE — Patient Instructions (Signed)
Good to see you!  I will call you about your labs, please come back in 2 weeks  [facility] follow as soon as you can

## 2014-08-31 NOTE — Assessment & Plan Note (Signed)
Very unusual today onset of bilateral stocking glove distribution of neuropathy with numbness, tingling, and pain complaints.  she is not diabetic evidence by her A1c of 5.5 today Check B-12 and HIV as well Encouraged Tylenol over-the-counter, close follow-up

## 2014-08-31 NOTE — Assessment & Plan Note (Signed)
Mixed mood [facility] Patient admits to having bipolar disorder as well as schizophrenia Very on affect, denies suicidal ideation and visual or auditory hallucinations. Suspicions of akathisia today, however did not feel comfortable giving her Benadryl or Cogentin with inconsistent follow-up  Encouraged her to seek [facility]

## 2014-09-01 LAB — HIV ANTIBODY (ROUTINE TESTING W REFLEX): HIV 1&2 Ab, 4th Generation: NONREACTIVE

## 2014-09-01 LAB — VITAMIN B12: Vitamin B-12: 388 pg/mL (ref 211–911)

## 2014-09-04 ENCOUNTER — Telehealth: Payer: Self-pay | Admitting: Family Medicine

## 2014-09-04 NOTE — Telephone Encounter (Signed)
Pt informed. Deseree Blount, CMA  

## 2014-09-04 NOTE — Telephone Encounter (Signed)
HIV and B12 negative and normal resp. No clear medical reason for neuropathy at this point, will ask nursing to call and inform given HIV test .   Murtis SinkSam Ashlyne Olenick, MD [facility] Resident, PGY-3 09/04/2014, 11:43 AM

## 2014-09-04 NOTE — Progress Notes (Signed)
I was the preceptor for this visit. 

## 2014-09-04 NOTE — Telephone Encounter (Signed)
Pt called back to get her results

## 2014-09-21 ENCOUNTER — Ambulatory Visit: Payer: Medicaid Other | Admitting: Family Medicine

## 2014-10-27 ENCOUNTER — Encounter (HOSPITAL_BASED_OUTPATIENT_CLINIC_OR_DEPARTMENT_OTHER): Payer: Medicaid Other

## 2014-10-27 ENCOUNTER — Ambulatory Visit (HOSPITAL_BASED_OUTPATIENT_CLINIC_OR_DEPARTMENT_OTHER): Payer: Medicaid Other | Attending: Internal Medicine

## 2014-10-27 VITALS — Ht 60.0 in | Wt 150.0 lb

## 2014-10-27 DIAGNOSIS — G4719 Other hypersomnia: Secondary | ICD-10-CM | POA: Diagnosis not present

## 2014-10-27 DIAGNOSIS — G4733 Obstructive sleep apnea (adult) (pediatric): Secondary | ICD-10-CM | POA: Diagnosis not present

## 2014-10-27 DIAGNOSIS — R0683 Snoring: Secondary | ICD-10-CM | POA: Diagnosis not present

## 2014-10-27 DIAGNOSIS — R5383 Other fatigue: Secondary | ICD-10-CM | POA: Diagnosis not present

## 2014-11-11 NOTE — Addendum Note (Signed)
Addended by: Nicki GuadalajaraKELLY, Cleo Villamizar A on: 11/11/2014 09:17 PM   Modules accepted: Level of Service

## 2014-11-11 NOTE — Sleep Study (Signed)
   NAME: Ruth Gardner DATE OF BIRTH:  1961/06/03 MEDICAL RECORD NUMBER [id number]  LOCATION: [facility]  PHYSICIAN: KELLY,THOMAS A  DATE OF STUDY: 10/27/2014  SLEEP STUDY TYPE: Nocturnal Polysomnogram               REFERRING PHYSICIAN: Chrystie NoseHilty, Kenneth C, MD  INDICATION FOR STUDY:  The patient is referred for a sleep study to evaluate for obstructive sleep apnea due to excessive daytime sleepiness, snoring, and fatigue.  EPWORTH SLEEPINESS SCORE:  17  which is positive for excessive daytime sleepiness HEIGHT: 5' (152.4 cm)  WEIGHT: 68.04 kg (150 lb)    Body mass index is 29.3 kg/(m^2).  NECK SIZE: 14 in.  MEDICATIONS:  traZODone (DESYREL) 100 MG tablet 100 mg, At bedtime PRN risperiDONE (RISPERDAL) 2 MG tablet 4 mg, Daily at bedtime omeprazole (PRILOSEC) 20 MG capsule 20 mg, Daily naproxen (NAPROSYN) 375 MG tablet 375 mg, 2 times daily with meals FLUoxetine (PROZAC) 20 MG capsule 40 mg, Daily  SLEEP ARCHITECTURE:  Total recording time was 360.5 minutes.  Total sleep time 207.5 minutes.  Percent sleep efficiency is significantly reduced at 57.6%.  The patient slept 13 minutes (6.3%) in stage I, 156.5 minutes (75.4%) in stage II, 0 minutes in stage III, and 38 minutes (18.3%) in REM sleep.  Latency to sleep onset was prolonged at 119 minutes.  Latency in REM sleep was prolonged at 203.5 minutes.  The patient slept 7.5% of the night in supine sleep.  There were 33 arousals.  Arousal index was 9.5.  There was mild to moderate snoring.  RESPIRATORY DATA:  Going to sleep time, therefore 0 apneic events.  There were a total of 6 hypopneas.  The overall apnea-hypopnea index (AHI) was 1.7 per hour.  The respiratory disturbance index (RDI) was 3.2/hr.   The AHI during REM sleep was 1.6 per hour.  This places the patient in the category of normal sleep breathing.  OXYGEN DATA:  The baseline oxygenation was 96%.  The lowest oxygen saturation in both non-REM and in REM sleep  was 93%.  CARDIAC DATA:  The average heart rate was 68 bpm.  The patient was in sinus rhythm.  MOVEMENT/PARASOMNIA:  There were 14 periodic limb movements with an index of 4.0.  IMPRESSION/ RECOMMENDATION:   Mild increased upper airway resistance syndrome.  Obstructive sleep apnea was not demonstrated. No indication for CPAP therapy. Mild to moderate snoring. Effort should be made to optimize nasal and oral pharyngeal patency. Abnormal sleep architecture with absence of stage III sleep and prolonged latency to REM sleep.  Reduced sleep efficiency at only 57.6%. If patient continues to experience excessive daytime sleepiness with improved sleep efficiency, consider further evaluation with a multiple latency sleep test (MLST) for possible idiopathic hypersomnolence/narcolepsy.    Ruth BihariKELLY,THOMAS A Diplomate, American Board of Sleep Medicine  ELECTRONICALLY SIGNED ON:  11/11/2014, 9:02 PM [facility] PH: [phone]   FX: [phone] ACCREDITED BY THE AMERICAN ACADEMY OF SLEEP MEDICINE

## 2014-11-17 ENCOUNTER — Encounter: Payer: Self-pay | Admitting: Cardiovascular Disease

## 2014-11-27 ENCOUNTER — Ambulatory Visit (INDEPENDENT_AMBULATORY_CARE_PROVIDER_SITE_OTHER): Payer: Medicaid Other | Admitting: Family Medicine

## 2014-11-27 ENCOUNTER — Encounter: Payer: Self-pay | Admitting: Family Medicine

## 2014-11-27 ENCOUNTER — Encounter (HOSPITAL_BASED_OUTPATIENT_CLINIC_OR_DEPARTMENT_OTHER): Payer: Medicaid Other

## 2014-11-27 ENCOUNTER — Other Ambulatory Visit (HOSPITAL_COMMUNITY)
Admission: RE | Admit: 2014-11-27 | Discharge: 2014-11-27 | Disposition: A | Discharge status: Home / Self Care | Payer: Medicaid Other | Source: Ambulatory Visit | Attending: Family Medicine | Admitting: Family Medicine

## 2014-11-27 VITALS — BP 107/78 | HR 68 | Temp 98.0°F | Ht 60.0 in | Wt 149.0 lb

## 2014-11-27 DIAGNOSIS — M25561 Pain in right knee: Secondary | ICD-10-CM

## 2014-11-27 DIAGNOSIS — F39 Unspecified mood [affective] disorder: Secondary | ICD-10-CM

## 2014-11-27 DIAGNOSIS — Z124 Encounter for screening for malignant neoplasm of cervix: Secondary | ICD-10-CM | POA: Diagnosis not present

## 2014-11-27 DIAGNOSIS — Z1151 Encounter for screening for human papillomavirus (HPV): Secondary | ICD-10-CM | POA: Diagnosis present

## 2014-11-27 DIAGNOSIS — M25562 Pain in left knee: Secondary | ICD-10-CM

## 2014-11-27 DIAGNOSIS — M25531 Pain in right wrist: Secondary | ICD-10-CM | POA: Diagnosis not present

## 2014-11-27 DIAGNOSIS — G4700 Insomnia, unspecified: Secondary | ICD-10-CM

## 2014-11-27 DIAGNOSIS — Z01419 Encounter for gynecological examination (general) (routine) without abnormal findings: Secondary | ICD-10-CM | POA: Insufficient documentation

## 2014-11-27 HISTORY — DX: Encounter for screening for malignant neoplasm of cervix: Z12.4

## 2014-11-27 MED ORDER — METHYLPREDNISOLONE ACETATE 40 MG/ML IJ SUSP
80.0000 mg | Freq: Once | INTRAMUSCULAR | Status: AC
  Administered 2014-11-27: 80 mg via INTRA_ARTICULAR

## 2014-11-27 NOTE — Progress Notes (Signed)
Ruth Hillier, MD, MS Phone: [phone]  Subjective:  Chief complaint -- Annual Physical  Pt Here for physical. Patient states she is doing well but has been in a significant amount of pain. She reports bilateral knee pain. Pain is intense. Localized to the medial, lateral, and inferior borders of the patella bilaterally. No radiation. Reports occasional swelling but not today. She denies trauma but endorses some recent falls b/c her knees "give out on me". She was not able to say if pain may have contributed to her knees buckling. Denies clicking or catching of her knees. Able to walk with the help of a cane. Denies lightheadedness, dizziness, LOC, SOB, CP, or balance issues associated w/ her falls. Falls only occur at night.  Also endorses severe Rt wrist pain. Occasionally swells. No radiation. No weakness or paresthesias. Pain has been present "for 3 years" and is no worse or better than before--however patient is in tears due to knee and wrist pain. Denies injury. Pain is worse when "cold air blows on it", and is immediately better when she compresses it w/ either her hand or a brace.   Both knee and wrist ailments are slightly improved w/ ibuprofen.      Menopause: yes  Vaginal Bleeding: no  Review of Systems  Constitutional: Negative for fever, weight loss and malaise/fatigue.  HENT: Negative for sore throat.   Eyes: Negative for blurred vision.  Respiratory: Negative for cough and shortness of breath.   Cardiovascular: Negative for chest pain, palpitations and leg swelling.  Gastrointestinal: Negative for heartburn, nausea, vomiting, diarrhea and melena.  Genitourinary: Negative for dysuria.  Musculoskeletal: Positive for back pain, joint pain and falls. Negative for myalgias.  Skin: Negative for rash.  Neurological: Negative for dizziness, tremors, speech change, focal weakness, loss of consciousness, weakness and headaches.  Psychiatric/Behavioral: Positive for hallucinations.  Negative for depression and suicidal ideas. The patient is not nervous/anxious.     Past Medical History Patient Active Problem List   Diagnosis Date Noted  . Screening for malignant neoplasm of cervix 11/27/2014  . Wrist pain, right 11/27/2014  . Polyneuropathy 08/31/2014  . Snoring 04/25/2014  . GERD (gastroesophageal reflux disease) 04/25/2014  . Chest pain 03/27/2014  . Blurred vision, bilateral 03/27/2014  . Mood disorder 03/27/2014  . Knee pain 04/09/2012  . Low back pain 04/09/2012    Medications- reviewed and updated Current Outpatient Prescriptions  Medication Sig Dispense Refill  . FLUoxetine (PROZAC) 20 MG capsule Take 40 mg by mouth daily.    . naproxen (NAPROSYN) 375 MG tablet Take 1 tablet (375 mg total) by mouth 2 (two) times daily with a meal. 40 tablet 0  . omeprazole (PRILOSEC) 20 MG capsule Take 1 capsule (20 mg total) by mouth daily. 30 capsule 6  . risperiDONE (RISPERDAL) 2 MG tablet Take 2 tablets (4 mg total) by mouth at bedtime. 60 tablet 0  . traZODone (DESYREL) 100 MG tablet Take 100 mg by mouth at bedtime as needed.     No current facility-administered medications for this visit.    Objective: BP 107/78 mmHg  Pulse 68  Temp(Src) 98 F (36.7 C) (Oral)  Ht 5' (1.524 m)  Wt 149 lb (67.586 kg)  BMI 29.10 kg/m2  LMP 10/14/2014 (Approximate) Gen: NAD, alert, cooperative with exam; but tearful throughout HEENT: NCAT, EOMI CV: RRR, no murmur Resp: CTABL, no wheezes, non-labored Abd: Soft, Non Tender, Non Distended, BS present, no guarding or organomegaly Pelvic Exam: normal external genitalia, vulva, vagina, cervix, uterus and  adnexa. Ext: No edema, warm   Knee: (similar exam bilaterally) Normal to inspection with no erythema or effusion or obvious bony abnormalities.  No obvious Baker's cysts Palpation showed patellar and lateral condyle tenderness. ROM reduced due to pain. Ligaments with solid consistent endpoints including ACL, PCL, LCL, MCL.   Negative Anterior Drawer/Lachman/Pivot Shift. Deferred meniscal tests due to patients pain. Painful patellar compression. Hamstring and quadriceps strength is normal. 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 Previous radiograph of her Lt knee showed some joint space narrowing of the medial compartment as well as at the lateral aspect of the trochlear grove on the sunrise view. Bilateral intraarticular steroid injects performed today in the clinic. Patient had relief of pain shortly after these were performed. My hope is that patient will receive 3-6 months of relief from these.   Wrist pain, right I was unable to assess this ailment to the full extent during our limited time today. I asked that she schedule a f/u appt for this at a later time. I felt as though this was appropriate since she reported its persistence for the past 3years. I also asked that she try to get in for a SDA if she has another episode of acute swelling in this wrist. Aspiration of any fluid collection may prove to be beneficial.   Mood disorder Patient on trazodone  nightly. This is technically a subtherapeutic dose for treatment of depression and it lays within the realm of dosing for insomnia. If psychiatrist is truly using for insomnia then I would strongly recommend that patient reducing dosing to  nightly, due to her recent reports of falls "only at night".   Will leave up to psychiatrist.    Meds ordered this encounter  Medications  . methylPREDNISolone acetate (DEPO-MEDROL) injection 80 mg    Sig:

## 2014-11-27 NOTE — Patient Instructions (Addendum)
It was a pleasure seeing you today in our clinic. Today we had your annual visit. Here is the treatment plan we have discussed and agreed upon together:   - I will mail you the results of the Pap smear we obtained today. - I would like to ask your psychiatrist about reducing your trazodone to 50mg  every night (you are currently taking 100mg  nightly). My hope is that this may reduce the amount of falls you have been experiencing. - We performed some knee injections today. You should have immediate relief from these today but this should dissipate later today (~5hrs). The total effect of these injections should be recognized in about 24-36hrs. My hope is that this effect should last ~25months but every person responds to these differently. - If your pain begins to become significantly worse. Your knee becomes red, swollen, or acutely tender, or you develop fevers, chills, nausea, or vomiting please be seen immediately in the Emergency department, urgent care, or our office.

## 2014-11-27 NOTE — Assessment & Plan Note (Signed)
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 Previous radiograph of her Lt knee showed some joint space narrowing of the medial compartment as well as at the lateral aspect of the trochlear grove on the sunrise view. Bilateral intraarticular steroid injects performed today in the clinic. Patient had relief of pain shortly after these were performed. My hope is that patient will receive 3-6 months of relief from these.

## 2014-11-27 NOTE — Progress Notes (Signed)
Procedure Note.  INJECTION: Bilateral Knee Patient was given informed consent, signed copy in the chart. Appropriate time out was taken. Area prepped and draped in usual sterile fashion. 1 cc of methylprednisolone 40 mg/ml plus  4 cc of 1% lidocaine without epinephrine was injected into the intraarticular space of the knee using a(n) lateral approach. The patient tolerated the procedure well. There were no complications. Post procedure instructions were given.   Kathee Delton, MD,MS,  PGY1 11/27/2014 5:23 PM

## 2014-11-27 NOTE — Assessment & Plan Note (Signed)
Patient on trazodone 100mg  nightly. This is technically a subtherapeutic dose for treatment of depression and it lays within the realm of dosing for insomnia. If psychiatrist is truly using for insomnia then I would strongly recommend that patient reducing dosing to 50mg  nightly, due to her recent reports of falls "only at night".   Will leave up to psychiatrist.

## 2014-11-27 NOTE — Assessment & Plan Note (Signed)
I was unable to assess this ailment to the full extent during our limited time today. I asked that she schedule a f/u appt for this at a later time. I felt as though this was appropriate since she reported its persistence for the past 3years. I also asked that she try to get in for a SDA if she has another episode of acute swelling in this wrist. Aspiration of any fluid collection may prove to be beneficial.

## 2014-11-28 LAB — CYTOLOGY - PAP

## 2014-11-29 ENCOUNTER — Encounter: Payer: Self-pay | Admitting: Family Medicine

## 2014-12-12 ENCOUNTER — Other Ambulatory Visit: Payer: Self-pay | Admitting: *Deleted

## 2014-12-12 MED ORDER — OMEPRAZOLE 20 MG PO CPDR: 20.0000 mg | DELAYED_RELEASE_CAPSULE | Freq: Every day | ORAL | Status: DC

## 2014-12-12 NOTE — Telephone Encounter (Signed)
Rx(s) sent to pharmacy electronically.  

## 2014-12-13 ENCOUNTER — Other Ambulatory Visit: Payer: Self-pay | Admitting: Family Medicine

## 2014-12-13 DIAGNOSIS — Z1231 Encounter for screening mammogram for malignant neoplasm of breast: Secondary | ICD-10-CM

## 2014-12-28 ENCOUNTER — Encounter (HOSPITAL_BASED_OUTPATIENT_CLINIC_OR_DEPARTMENT_OTHER): Payer: Medicaid Other

## 2015-01-08 ENCOUNTER — Ambulatory Visit (HOSPITAL_COMMUNITY): Payer: Medicaid Other

## 2015-03-05 ENCOUNTER — Encounter: Payer: Self-pay | Admitting: Family Medicine

## 2015-03-05 ENCOUNTER — Ambulatory Visit (HOSPITAL_COMMUNITY)
Admission: RE | Admit: 2015-03-05 | Discharge: 2015-03-05 | Disposition: A | Discharge status: Home / Self Care | Payer: Medicaid Other | Source: Ambulatory Visit | Attending: Family Medicine | Admitting: Family Medicine

## 2015-03-05 DIAGNOSIS — Z1231 Encounter for screening mammogram for malignant neoplasm of breast: Secondary | ICD-10-CM | POA: Insufficient documentation

## 2015-03-14 ENCOUNTER — Telehealth: Payer: Self-pay | Admitting: Family Medicine

## 2015-03-14 NOTE — Telephone Encounter (Signed)
Pt wants a letter saying she cannot go back to work because she is in pain.  She hasnt worked since 2011.   Dr here gives her shots on her knees and back Please advise

## 2015-03-15 ENCOUNTER — Telehealth: Payer: Self-pay | Admitting: Family Medicine

## 2015-03-15 NOTE — Telephone Encounter (Signed)
Left message on patient voicemail that patient would need to call back and schedule an apointment with PCP for this type of letter.

## 2015-03-15 NOTE — Telephone Encounter (Signed)
Assuming patient is asking for a long-term request (not just a day or 2) she will need to be seen for this type of note. The knee injections she would be receiving would likely be for pain relief in attempts to go back to work, NOT to keep her from going to work. But I would have to look further into her chart before I'd go into too much detail.

## 2015-03-15 NOTE — Telephone Encounter (Signed)
Pt called and was told to make an appointment. She made on for 04/19/15. jw

## 2015-04-19 ENCOUNTER — Ambulatory Visit: Payer: Medicaid Other | Admitting: Family Medicine

## 2015-11-14 ENCOUNTER — Encounter: Payer: Medicaid Other | Admitting: Family Medicine

## 2016-02-25 ENCOUNTER — Other Ambulatory Visit: Payer: Self-pay | Admitting: Family Medicine

## 2016-02-25 DIAGNOSIS — Z1231 Encounter for screening mammogram for malignant neoplasm of breast: Secondary | ICD-10-CM

## 2016-03-06 ENCOUNTER — Ambulatory Visit: Payer: Medicaid Other

## 2016-03-19 ENCOUNTER — Telehealth: Payer: Self-pay | Admitting: Family Medicine

## 2016-03-19 NOTE — Telephone Encounter (Signed)
Pt would like to know if Dr. Wende MottMcKeag would order adult diapers. Pt is having accidents all through the day and night, case worker recommenced them. Please advise. Thanks! ep

## 2016-03-21 NOTE — Telephone Encounter (Signed)
Patient definitely need reevaluation if she is having a hard time getting to a standing position.   If patient is having new incontinence (which may be the case) she needs to go to the ED for immediate evaluation.  Called patient: patient explained that incontinence is new and is occasionally from (1) not being able to get to the bathroom in time and occasionally b/c (2) she doesn't know she has to void until she already is. - I informed her that b/c of scenario (2) I would strongly suggest immediate workup in ED; patient stated her understanding, but didn't seem too concerned over the phone. I stressed this again and patient's attitude didn't change, making me believe patient will likely not have this checked out in the ED.   I informed patient that no insurance company will pay for adult diapers without an evaluation during a clinic visit on file. Her best option is her grocery store until that time. Patient stated her understanding.   Patient brought up something about her lawyer and social worker; she said they would like a note about work. I have only met this patient once. I informed her I would need to address this during an appt.

## 2016-03-21 NOTE — Telephone Encounter (Incomplete)
Patient definitely need reevaluation if she is having a hard time getting to a standing position.   If patient is having new incontinence (which may be the case) she needs to go to the ED for immediate evaluation.  Called patient: patient explained that incontinence is new and is occasionally from (1) not being able to get to the bathroom in time and occasionally b/c (2) she doesn't know she has to void until she already is. - I informed her that b/c of scenario (2) I would strongly suggest immediate workup in ED; patient stated her understanding, but didn't seem too concerned over the phone. I stressed this again and patient's attitude didn't change, making me believe patient will likely not have this checked out in the ED.

## 2016-03-21 NOTE — Telephone Encounter (Signed)
2nd request. Pt states she also needs something to help her get on and off the couch and on and off the bed. Please advise. Thanks! ep

## 2016-03-24 ENCOUNTER — Ambulatory Visit: Payer: Medicaid Other

## 2016-03-25 ENCOUNTER — Telehealth: Payer: Self-pay | Admitting: Internal Medicine

## 2016-03-25 NOTE — Telephone Encounter (Signed)
New message    Pt verbalized that she is calling for the rn to update medication list

## 2016-03-25 NOTE — Telephone Encounter (Signed)
Patient needed name of medication prescribed for acid reflux - prilosec. Gave her name of this as well as dose recommendations.  Pt aware to call if concerns/questions prior to next f/u visit.

## 2016-03-31 ENCOUNTER — Ambulatory Visit: Payer: Medicaid Other

## 2016-03-31 ENCOUNTER — Ambulatory Visit
Admission: RE | Admit: 2016-03-31 | Discharge: 2016-03-31 | Disposition: A | Discharge status: Home / Self Care | Payer: Medicaid Other | Source: Ambulatory Visit | Attending: Family Medicine | Admitting: Family Medicine

## 2016-03-31 DIAGNOSIS — Z1231 Encounter for screening mammogram for malignant neoplasm of breast: Secondary | ICD-10-CM

## 2016-04-03 ENCOUNTER — Encounter: Payer: Self-pay | Admitting: Family Medicine

## 2016-04-03 ENCOUNTER — Ambulatory Visit (INDEPENDENT_AMBULATORY_CARE_PROVIDER_SITE_OTHER): Payer: Medicaid Other | Admitting: Family Medicine

## 2016-04-03 VITALS — BP 126/70 | HR 72 | Temp 98.5°F | Ht 60.0 in | Wt 151.4 lb

## 2016-04-03 DIAGNOSIS — M25562 Pain in left knee: Secondary | ICD-10-CM | POA: Diagnosis not present

## 2016-04-03 DIAGNOSIS — Z79899 Other long term (current) drug therapy: Secondary | ICD-10-CM

## 2016-04-03 DIAGNOSIS — M25561 Pain in right knee: Secondary | ICD-10-CM | POA: Diagnosis not present

## 2016-04-03 LAB — CBC
HEMATOCRIT: 38.1 % (ref 35.0–45.0)
Hemoglobin: 12.9 g/dL (ref 11.7–15.5)
MCH: 28.2 pg (ref 27.0–33.0)
MCHC: 33.9 g/dL (ref 32.0–36.0)
MCV: 83.2 fL (ref 80.0–100.0)
MPV: 9.7 fL (ref 7.5–12.5)
Platelets: 333 10*3/uL (ref 140–400)
RBC: 4.58 MIL/uL (ref 3.80–5.10)
RDW: 13.8 % (ref 11.0–15.0)
WBC: 7.2 10*3/uL (ref 3.8–10.8)

## 2016-04-03 LAB — TSH: TSH: 1.8 m[IU]/L

## 2016-04-03 MED ORDER — METHYLPREDNISOLONE ACETATE 40 MG/ML IJ SUSP
40.0000 mg | Freq: Once | INTRAMUSCULAR | Status: AC
  Administered 2016-04-03: 40 mg via INTRAMUSCULAR

## 2016-04-03 MED ORDER — OMEPRAZOLE 20 MG PO CPDR: 20.0000 mg | DELAYED_RELEASE_CAPSULE | Freq: Every day | ORAL | 11 refills | Status: DC

## 2016-04-03 NOTE — Progress Notes (Signed)
   HPI  CC: Bilateral knee pain Patient is here for complaints for bilateral knee pain. She states that pain is "deep" and diffuse. It is worse with all prolonged standing and ambulation. She denies any swelling or effusion. She denies any injuries. No popping locking or catching. Knee pain is equal bilaterally. She states that this pain is very debilitating to the point that she is now having issues with incontinence. She states that she has had a significantly difficult time getting up to use the bathroom in time due to the amount of pain she has in her knees.   Review of Systems   See HPI for ROS. All other systems reviewed and are negative.  CC, SH/smoking status, and VS noted  Objective: BP 126/70   Pulse 72   Temp 98.5 F (36.9 C) (Oral)   Ht 5' (1.524 m)   Wt 151 lb 6.4 oz (68.7 kg)   LMP 10/14/2014 (Approximate)   BMI 29.57 kg/m  Gen: NAD, alert, cooperative with exam; but tearful throughout, abnormal affect. CV: RRR, no murmur Resp: CTABL, no wheezes, non-labored Knee, bilateral: Normal to inspection with no erythema or effusion or obvious bony abnormalities.  No obvious Baker's cysts Palpation showed patellar and lateral condyle tenderness. ROM reduced due to pain. Ligaments with solid consistent endpoints including ACL, PCL, LCL, MCL.  Negative Anterior Drawer/Lachman/Pivot Shift. Deferred meniscal tests due to patients pain. Painful patellar compression. Hamstring and quadriceps strength is normal. Neuro: Alert and oriented, No gross deficits, abnormal affect, occasional stuttering.  INJECTION: Bilateral knees Patient was given informed consent, signed copy in the chart. Appropriate time out was taken. Area prepped and draped in usual sterile fashion. 1 cc of methylprednisolone 40 mg/ml plus  4 cc of 1% lidocaine without epinephrine was injected into each knee using a(n) inferior lateral approach. The patient tolerated the procedure well. There were no complications. Post  procedure instructions were given.   Assessment and plan:  Knee pain Chronic, worsening: Patient has extremely painful bilateral knees. At our last visit (over a year ago) I provided a steroid injection for both knees and patient endorsed good relief with that procedure. Patient endorses worsening pain that is now causing her to have incontinence due to the inability to get to the restroom in time secondary to her knee pain. No red flag symptoms. Patient has no signs of spinal cord pathology. - Bilateral knee injection - Close follow-up to assess effectiveness of injections   Orders Placed This Encounter  Procedures  . CBC  . BASIC METABOLIC PANEL WITH GFR  . TSH  . Lipid panel  . Sedimentation rate    Meds ordered this encounter  Medications  . methylPREDNISolone acetate (DEPO-MEDROL) injection 40 mg  . omeprazole (PRILOSEC) 20 MG capsule    Sig: Take 1 capsule (20 mg total) by mouth daily.    Dispense:  30 capsule    Refill:  11    Please advise patient to make appointment with cardiologist or obtain refills from PCP     Kathee DeltonIan D Lincoln Kleiner, MD,MS,  PGY3 04/03/2016 6:25 PM

## 2016-04-03 NOTE — Assessment & Plan Note (Signed)
Chronic, worsening: Patient has extremely painful bilateral knees. At our last visit (over a year ago) I provided a steroid injection for both knees and patient endorsed good relief with that procedure. Patient endorses worsening pain that is now causing her to have incontinence due to the inability to get to the restroom in time secondary to her knee pain. No red flag symptoms. Patient has no signs of spinal cord pathology. - Bilateral knee injection - Close follow-up to assess effectiveness of injections

## 2016-04-03 NOTE — Patient Instructions (Signed)
It was a pleasure seeing you today in our clinic. Today we discussed your knee pain. Here is the treatment plan we have discussed and agreed upon together:   - Make sure to keep the injection sites clean and dry for the next 24 hours. - Regarding your right elbow pain, ice this area regularly. Take Tylenol 3 times a day as needed for pain.

## 2016-04-04 LAB — LIPID PANEL
Cholesterol: 221 mg/dL — ABNORMAL HIGH (ref 125–200)
HDL: 34 mg/dL — ABNORMAL LOW (ref >=46)
LDL Cholesterol: 150 mg/dL — ABNORMAL HIGH (ref <130)
Total CHOL/HDL Ratio: 6.5 Ratio — ABNORMAL HIGH (ref <=5.0)
Triglycerides: 183 mg/dL — ABNORMAL HIGH (ref <150)
VLDL: 37 mg/dL — ABNORMAL HIGH (ref <30)

## 2016-04-04 LAB — BASIC METABOLIC PANEL WITH GFR
BUN: 11 mg/dL (ref 7–25)
CO2: 24 mmol/L (ref 20–31)
Calcium: 9.1 mg/dL (ref 8.6–10.4)
Chloride: 108 mmol/L (ref 98–110)
Creat: 0.93 mg/dL (ref 0.50–1.05)
GFR, Est African American: 81 mL/min (ref >=60)
GFR, Est Non African American: 70 mL/min (ref >=60)
Glucose, Bld: 83 mg/dL (ref 65–99)
Potassium: 3.9 mmol/L (ref 3.5–5.3)
Sodium: 140 mmol/L (ref 135–146)

## 2016-04-04 LAB — SEDIMENTATION RATE: Sed Rate: 34 mm/hr — ABNORMAL HIGH (ref 0–30)

## 2016-04-29 ENCOUNTER — Ambulatory Visit: Payer: Medicaid Other | Admitting: Internal Medicine

## 2016-05-05 ENCOUNTER — Telehealth: Payer: Self-pay | Admitting: Family Medicine

## 2016-05-05 NOTE — Telephone Encounter (Signed)
Pt would like Dr. Wende MottMcKeag to give her a call, today if possible. Pt's lawyer  would like to know if he would be willing to fill out a form to get disability started, SSI. Please advise. Thanks! ep

## 2016-05-06 NOTE — Telephone Encounter (Signed)
Pt would like to get new x-rays and try physical therapy and would still like for you to give her a call. Please advise. Thanks! ep

## 2016-05-06 NOTE — Telephone Encounter (Signed)
Pt was advised. Pt would like for you to give her a call to discuss her concerns. Please advise. Thanks! ep

## 2016-05-06 NOTE — Telephone Encounter (Signed)
I would be happy to speak to patient's lawyer if a number can be provided.   At this time it is my medical opinion that we have not actively sought out all of the available treatment options for this patient to guiltlessly recommend disability. At this point patient has only received bilateral knee injections approximately Q1712mths. She has not been interested in Physical Therapy nor an exercise/weight loss program -- both of which I firmly believe would be invaluable for this patient's pain relief (as well as any underlying depression). Patient hasn't undergone a knee XR in ~6613yrs. Without new imaging it is difficult to say with confidence but my assumption is that patient likely has some years before needing knee arthoplasty/replacement (as she still does not seem to require knee injections for pain relief very often) -- let alone disability.  My thought is that patient is likely falling victim most to deconditioning. Because of this, disability may provide more time to be sedentary and deconditioned, rather than any benefit.   I would be happy to place a referral to Physical Therapy if patient became interested in this treatment course. And I would be happy to provide documentation for its necessity to receive time away from work. But I do not believe disability would be appropriate.

## 2016-05-07 NOTE — Telephone Encounter (Signed)
Called patient to discuss the possibility of signing disability forms. I informed her that at this time I would be unwilling to write any disability forms on the sole basis of her knee pain. However, during our conversation and is reminded that patient's mental state is not on a normal level. Due to my limited contact with her I do not feel comfortable fully moving forward with disability on a mental/intellectual basis. However, patient does see psychiatry relatively frequently. I've asked that she make an appointment with them and ask them to fill out disability paperwork. I informed her that if they are unwilling to ill out these documents than I would ask her to have them write me a letter discussing their impression of her mental and intellectual state. Based on that latter I would then feel comfortable making my own decision on whether or not I felt she was qualified for disability. Patient stated her understanding and willingness to do so. She had no further questions.  In summary, patient is to: - Ask psychiatry to fill out her disability paperwork - if they are unwilling to do so: Ask psychiatry to write a letter explaining their impression of her intellectual/psychological state - Bring me this letter to review. I would then use this additional insight to decide on whether I felt disability was an appropriate status for her.

## 2016-05-08 NOTE — Telephone Encounter (Signed)
Pt states her psychiatrist would like to speak with you. [phone] ext. [phone]. Please advise. Thanks! ep

## 2016-05-09 NOTE — Telephone Encounter (Signed)
I will try to contact her psychiatrist as soon as I have the time available.

## 2016-05-15 NOTE — Telephone Encounter (Signed)
Called patient psychiatrist. Left voicemail.
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
## 2016-05-15 NOTE — Telephone Encounter (Signed)
Please ask patient to bring in paperwork for disability. I don't need to have her set up an appointment for this. Just the paperwork.

## 2016-05-19 ENCOUNTER — Telehealth: Payer: Self-pay | Admitting: Family Medicine

## 2016-05-19 NOTE — Telephone Encounter (Signed)
Pt called and will be dropping off forms for the doctor to fill out and mail to the law firm. She would also like a copy of these signed and left up front for her to pick up. Please call patient when this is done. jw.

## 2016-05-23 NOTE — Telephone Encounter (Signed)
I checked the red folder upfront and there were no documents from her. I guess they will inform us when she drops them off.Ruth Gardner, CMA

## 2016-05-26 NOTE — Telephone Encounter (Signed)
I will be working on this paperwork this week. It is MUCH more involved than most things we do so they will have to be patient.

## 2016-05-27 NOTE — Telephone Encounter (Signed)
Nevermind! These forms were faxed over this afternoon and I have them now.  I will start working on this paperwork tomorrow

## 2016-05-27 NOTE — Telephone Encounter (Signed)
Called patient's therapist's office asking for medical records, as I will not be able to fill out the required paperwork until I receive these records. I had called at the beginning of the month for these and still have not received them.  I was informed from the operator that I am trying to reach a "Ruth Gardner".  No answer, left voicemail.   Is there any way that nursing could try this during more opportune business hours tomorrow? [phone]  Thank you!

## 2016-05-29 ENCOUNTER — Encounter: Payer: Self-pay | Admitting: Family Medicine

## 2016-05-29 ENCOUNTER — Telehealth: Payer: Self-pay

## 2016-05-29 DIAGNOSIS — F4312 Post-traumatic stress disorder, chronic: Secondary | ICD-10-CM | POA: Insufficient documentation

## 2016-05-29 DIAGNOSIS — F32A Depression, unspecified: Secondary | ICD-10-CM | POA: Insufficient documentation

## 2016-05-29 DIAGNOSIS — R443 Hallucinations, unspecified: Secondary | ICD-10-CM | POA: Insufficient documentation

## 2016-05-29 DIAGNOSIS — F09 Unspecified mental disorder due to known physiological condition: Secondary | ICD-10-CM | POA: Insufficient documentation

## 2016-05-29 DIAGNOSIS — F329 Major depressive disorder, single episode, unspecified: Secondary | ICD-10-CM | POA: Insufficient documentation

## 2016-05-29 NOTE — Telephone Encounter (Signed)
Patient calls, she would like to be contacted when forms are completed. She needs to pick them up to take to her lawyer.

## 2016-05-29 NOTE — Telephone Encounter (Signed)
[facility] She just wants to make sure you received the records.  Please call  Angelique BlonderDenise at  [phone]. Sunday SpillersSharon T Saunders, CMA

## 2016-05-30 NOTE — Telephone Encounter (Signed)
Spoke with patient, she states she has to have these forms turned in by 06/06/16. Informed patient that upon review of the notes it looks like MD is working on them but he will not be back in the office until 12/27. Patient states she will come to office when he get back and wait for forms because they have a deadline. FYI to PCP.

## 2016-05-30 NOTE — Telephone Encounter (Signed)
Patient is calling regarding her forms.  States that she needs then by the end of next week at the latest.  Please call her with the status of their completion.  Edis Huish,CMA

## 2016-05-30 NOTE — Telephone Encounter (Signed)
Patient called regarding forms that she needs completed by the end of the year. Jazmin Hartsell,CMA

## 2016-06-04 NOTE — Telephone Encounter (Signed)
Forms were filled out and placed in "to be mailed" box on 12/22 (going to lawyer). This issue is complete. If patient has any additional problems please relay these issue to me directly and immediately as that type of behavior (coming to office and waiting) is not how I, her PCP, works. Paperwork takes time.

## 2016-06-04 NOTE — Telephone Encounter (Signed)
Pt is calling again and would like to come in and pick up her forms that have been here 05/19/16 and she really needs them today. Please call patient and give her an update on the status. jw

## 2016-06-04 NOTE — Telephone Encounter (Signed)
Per asha dr Wende Mottmckeag mailed forms to pts lawyer on 12/22. Deseree Bruna PotterBlount, CMA

## 2016-07-04 ENCOUNTER — Ambulatory Visit: Payer: Medicaid Other | Admitting: Family Medicine

## 2016-10-14 ENCOUNTER — Telehealth: Payer: Self-pay | Admitting: Family Medicine

## 2016-10-14 NOTE — Telephone Encounter (Signed)
Pt needs a referral for PT for knee pain. ep

## 2016-10-15 ENCOUNTER — Other Ambulatory Visit: Payer: Self-pay | Admitting: Family Medicine

## 2016-10-15 DIAGNOSIS — M25561 Pain in right knee: Principal | ICD-10-CM

## 2016-10-15 DIAGNOSIS — M25562 Pain in left knee: Principal | ICD-10-CM

## 2016-10-15 DIAGNOSIS — G8929 Other chronic pain: Secondary | ICD-10-CM

## 2016-10-15 NOTE — Telephone Encounter (Signed)
We discussed this option at the last visit. Glad to see patient is willing to try this options. Referral sent

## 2016-10-20 ENCOUNTER — Ambulatory Visit (INDEPENDENT_AMBULATORY_CARE_PROVIDER_SITE_OTHER): Payer: Self-pay | Admitting: Orthopaedic Surgery

## 2016-10-23 ENCOUNTER — Ambulatory Visit: Payer: Medicaid Other | Admitting: Physical Therapy

## 2016-11-11 ENCOUNTER — Ambulatory Visit (INDEPENDENT_AMBULATORY_CARE_PROVIDER_SITE_OTHER): Payer: Self-pay | Admitting: Orthopaedic Surgery

## 2016-11-24 ENCOUNTER — Ambulatory Visit (INDEPENDENT_AMBULATORY_CARE_PROVIDER_SITE_OTHER): Payer: Self-pay | Admitting: Orthopaedic Surgery

## 2016-12-01 ENCOUNTER — Ambulatory Visit (INDEPENDENT_AMBULATORY_CARE_PROVIDER_SITE_OTHER): Payer: Medicaid Other | Admitting: Orthopaedic Surgery

## 2016-12-01 ENCOUNTER — Ambulatory Visit (INDEPENDENT_AMBULATORY_CARE_PROVIDER_SITE_OTHER): Payer: Medicaid Other

## 2016-12-01 ENCOUNTER — Encounter (INDEPENDENT_AMBULATORY_CARE_PROVIDER_SITE_OTHER): Payer: Self-pay | Admitting: Orthopaedic Surgery

## 2016-12-01 DIAGNOSIS — G8929 Other chronic pain: Secondary | ICD-10-CM

## 2016-12-01 DIAGNOSIS — M25562 Pain in left knee: Secondary | ICD-10-CM | POA: Diagnosis not present

## 2016-12-01 DIAGNOSIS — M25561 Pain in right knee: Secondary | ICD-10-CM

## 2016-12-01 DIAGNOSIS — M1711 Unilateral primary osteoarthritis, right knee: Secondary | ICD-10-CM | POA: Diagnosis not present

## 2016-12-01 NOTE — Progress Notes (Signed)
Office Visit Note   Patient: Ruth Gardner           Date of Birth: [birth year]           MRN: [id number] Visit Date: 12/01/2016              Requested by: Ruth Gardner, Ruth D, MD [address] [place], [place] [id number] PCP: Ruth Gardner, Ruth D, MD   Assessment & Plan: Visit Diagnoses:  1. Chronic pain of both knees   2. Unilateral primary osteoarthritis, right knee     Plan: Patient has advanced right knee degenerative joint disease with valgus deformity. Patient is scheduled to have extensive dental work done. I recommend that she get the urgent stuff done prior to knee replacement. She will give us a call when she is ready. We discussed the risks benefits alternatives to surgery including infection, incomplete relief of pain, stiffness, DVT. She understands and wishes to proceed.  Follow-Up Instructions: Return if symptoms worsen or fail to improve.   Orders:  Orders Placed This Encounter  Procedures  . XR KNEE 3 VIEW RIGHT  . XR KNEE 3 VIEW LEFT   No orders of the defined types were placed in this encounter.     Procedures: No procedures performed   Clinical Data: No additional findings.   Subjective: Chief Complaint  Patient presents with  . Knee Pain    bilateral knee pain    Patient is a 56 year old female who comes in with right knee pain mainly for 2 years. This is progressively gotten worse. She relates with a cane with partial relief. She has had injections with about 2-4 weeks of relief. She is currently disabled from back and knee pain. The pain does not radiate. Denies any injuries.    Review of Systems  Constitutional: Negative.   HENT: Negative.   Eyes: Negative.   Respiratory: Negative.   Cardiovascular: Negative.   Endocrine: Negative.   Musculoskeletal: Negative.   Neurological: Negative.   Hematological: Negative.   Psychiatric/Behavioral: Negative.   All other systems reviewed and are negative.    Objective: Vital Signs: LMP  10/14/2014 (Approximate)   Physical Exam  Constitutional: She is oriented to person, place, and time. She appears well-developed and well-nourished.  HENT:  Head: Normocephalic and atraumatic.  Eyes: EOM are normal.  Neck: Neck supple.  Pulmonary/Chest: Effort normal.  Abdominal: Soft.  Neurological: She is alert and oriented to person, place, and time.  Skin: Skin is warm. Capillary refill takes less than 2 seconds.  Psychiatric: She has a normal mood and affect. Her behavior is normal. Judgment and thought content normal.  Nursing note and vitals reviewed.   Ortho Exam Right knee exam shows valgus deformity. No flexion contracture. Globally tender throughout the knee. Specialty Comments:  No specialty comments available.  Imaging: Xr Knee 3 View Left  Result Date: 12/01/2016 Mild osteoarthritis  Xr Knee 3 View Right  Result Date: 12/01/2016 Advanced degenerative joint disease with valgus deformity    PMFS History: Patient Active Problem List   Diagnosis Date Noted  . Chronic post-traumatic stress disorder (PTSD) 05/29/2016  . Hallucinations 05/29/2016  . Depression 05/29/2016  . Cognitive dysfunction 05/29/2016  . Screening for malignant neoplasm of cervix 11/27/2014  . Wrist pain, right 11/27/2014  . Polyneuropathy 08/31/2014  . Snoring 04/25/2014  . GERD (gastroesophageal reflux disease) 04/25/2014  . Chest pain 03/27/2014  . Blurred vision, bilateral 03/27/2014  . Mood disorder (HCC) 03/27/2014  . Knee pain 04/09/2012  .  Low back pain 04/09/2012   Past Medical History:  Diagnosis Date  . Anxiety   . Bipolar 1 disorder (HCC)   . Depression   . Schizophrenia (HCC)     No family history on file.  Past Surgical History:  Procedure Laterality Date  . HERNIA REPAIR     Social History   Occupational History  . Not on file.   Social History Main Topics  . Smoking status: Current Every Day Smoker    Packs/day: 0.30    Years: 21.00    Types:  Cigarettes  . Smokeless tobacco: Never Used  . Alcohol use No  . Drug use: No  . Sexual activity: Not on file

## 2017-02-17 ENCOUNTER — Other Ambulatory Visit: Payer: Self-pay | Admitting: Family Medicine

## 2017-02-17 DIAGNOSIS — Z1231 Encounter for screening mammogram for malignant neoplasm of breast: Secondary | ICD-10-CM

## 2017-02-26 ENCOUNTER — Ambulatory Visit (INDEPENDENT_AMBULATORY_CARE_PROVIDER_SITE_OTHER): Payer: Medicaid Other | Admitting: Physician Assistant

## 2017-03-13 ENCOUNTER — Other Ambulatory Visit: Payer: Self-pay | Admitting: Family Medicine

## 2017-04-01 ENCOUNTER — Ambulatory Visit
Admission: RE | Admit: 2017-04-01 | Discharge: 2017-04-01 | Disposition: A | Discharge status: Home / Self Care | Payer: Medicaid Other | Source: Ambulatory Visit | Attending: Family Medicine | Admitting: Family Medicine

## 2017-04-01 DIAGNOSIS — Z1231 Encounter for screening mammogram for malignant neoplasm of breast: Secondary | ICD-10-CM

## 2017-04-13 ENCOUNTER — Other Ambulatory Visit: Payer: Self-pay | Admitting: *Deleted

## 2017-04-13 ENCOUNTER — Other Ambulatory Visit: Payer: Self-pay | Admitting: Family Medicine

## 2017-04-13 MED ORDER — OMEPRAZOLE 20 MG PO CPDR: 20.0000 mg | DELAYED_RELEASE_CAPSULE | Freq: Every day | ORAL | 11 refills | Status: DC

## 2017-05-07 ENCOUNTER — Encounter: Payer: Medicaid Other | Admitting: Family Medicine

## 2017-05-08 ENCOUNTER — Encounter: Payer: Medicaid Other | Admitting: Family Medicine

## 2017-05-18 ENCOUNTER — Encounter: Payer: Medicaid Other | Admitting: Family Medicine

## 2017-05-25 ENCOUNTER — Encounter: Payer: Medicaid Other | Admitting: Family Medicine

## 2017-06-10 ENCOUNTER — Encounter: Payer: Medicaid Other | Admitting: Family Medicine

## 2017-06-18 ENCOUNTER — Ambulatory Visit (INDEPENDENT_AMBULATORY_CARE_PROVIDER_SITE_OTHER): Payer: Medicaid Other | Admitting: Family Medicine

## 2017-06-18 ENCOUNTER — Other Ambulatory Visit: Payer: Self-pay

## 2017-06-18 ENCOUNTER — Encounter: Payer: Self-pay | Admitting: Family Medicine

## 2017-06-18 VITALS — BP 118/60 | HR 74 | Temp 97.8°F | Ht 60.0 in | Wt 150.4 lb

## 2017-06-18 DIAGNOSIS — Z0000 Encounter for general adult medical examination without abnormal findings: Secondary | ICD-10-CM

## 2017-06-18 DIAGNOSIS — M170 Bilateral primary osteoarthritis of knee: Secondary | ICD-10-CM | POA: Diagnosis not present

## 2017-06-18 NOTE — Patient Instructions (Signed)
Ruth Gardner, you were seen today for a check up. I would highly recommend that you come back to get your flu shot and tetanus shot time in the future.  I have placed an order for you to receive some diapers and also put in an order for a rolling walker with seat.  The diapers should be mailed to your house as well as the walker.   It was very nice to meet you today if you have any questions or concerns please come back to see me or give me a call the office.   Ruth Gardner L. Myrtie SomanWarden, MD [facility] Resident PGY-2 06/18/2017 9:24 AM

## 2017-06-18 NOTE — Progress Notes (Signed)
    Subjective:  Ruth Gardner is a 57 y.o. female who [facility] the [facility] today for checkup  HPI:  Bilateral osteoarthritis of the knees Ruth Gardner has previously had steroid injections into the knees bilaterally with minimal relief.  On the left she has advanced osteoarthritis and has been seen by [facility]. [facility] at [facility] who recommends knee replacement.  She has very limited range of motion in both knees and gets around with a cane.  She is not currently driving.  She denies any new weakness, numbness, tingling in her lower extremities.  She denies any recent falls or new trauma.  She reports that she has fortunately been having urinary accidents because she is not able to make it to the restroom due to her relative immobility due to her knees.  She is requesting a prescription for diapers.  Patient has Medicaid.  Mood disorder Stable.  Follows up regularly with psychiatry and therapist who manages her medication.  She denies any thoughts of self-harm or harming others.  GERD Stable    PMH: Mood disorder, bilateral osteoarthritis of the knees Tobacco use: Never used tobacco Medication: reviewed and updated ROS: see HPI   Objective:  Physical Exam: BP 118/60   Pulse 74   Temp 97.8 F (36.6 C) (Oral)   Ht 5' (1.524 m)   Wt 150 lb 6.4 oz (68.2 kg)   LMP 10/14/2014 (Approximate)   SpO2 98%   BMI 29.37 kg/m   Gen: 57 year old female in NAD, resting comfortably CV: RRR with no murmurs appreciated Pulm: NWOB, CTAB with no crackles, wheezes, or rhonchi GI: Normal bowel sounds present. Soft, Nontender, Nondistended. MSK: no edema, cyanosis, or clubbing noted.  Bilateral knees with significant medial lateral joint line tenderness.  Passive and active range of motion severely limited in the knees bilaterally.  Skin: warm, dry Neuro: grossly normal, moves all extremities Psych: Normal affect and thought content  No results found for this or any previous visit (from  the past 72 hour(s)).   Assessment/Plan:  Osteoarthritis of knees, bilateral Patient with significant osteoarthritis of the knees bilaterally with advanced arthritis in the left requiring knee replacement.  She has severely limited passive and active range of motion in the knees bilaterally and I worry for her stability with a cane.  Placed order for rolling walker with seat as this would significantly improve patient's quality of life and allow for her to safely move about.  Recommended that she contact me in the next couple of weeks if there is any issue obtaining this.  Recommended that she follow-up with Dr. Roda ShuttersXu for knee replacement in the near future and discuss with him what options might be appropriate for her right knee.  Additionally, placed order for adult diapers for patient and these should be delivered to her.  GERD Stable.  Continue omeprazole 20 mg daily  Mood disorder Continue to follow with psychiatry and therapist.  Healthcare maintenance Due for tetanus shot, colonoscopy and hepatitis C screening.  Patient stated that she will come back for shot and screening.  She does not want to have a colonoscopy at this time.  We will continue to encourage her in the future.  Cian Costanzo L. Myrtie SomanWarden, MD [facility] Resident PGY-2 06/22/2017 10:50 AM

## 2017-06-22 ENCOUNTER — Encounter: Payer: Self-pay | Admitting: Family Medicine

## 2017-06-22 NOTE — Assessment & Plan Note (Addendum)
Patient with significant osteoarthritis of the knees bilaterally with advanced arthritis in the left requiring knee replacement.  She has severely limited passive and active range of motion in the knees bilaterally and I worry for her stability with a cane.  Placed order for rolling walker with seat as this would significantly improve patient's quality of life and allow for her to safely move about.  Recommended that she contact me in the next couple of weeks if there is any issue obtaining this.  Recommended that she follow-up with Dr. Roda ShuttersXu for knee replacement in the near future and discuss with him what options might be appropriate for her right knee.  Additionally, placed order for adult diapers for patient and these should be delivered to her.

## 2017-06-23 ENCOUNTER — Telehealth: Payer: Self-pay | Admitting: *Deleted

## 2017-06-23 NOTE — Telephone Encounter (Signed)
Thank you. Will complete this when I'm in the office.  Daniel L. Myrtie SomanWarden, MD [facility] Resident PGY-2 06/23/2017 10:53 AM

## 2017-06-23 NOTE — Telephone Encounter (Signed)
Order form for incontinence supplies placed in PCP's box for signature. Kinnie FeilL. Daliana Leverett, RN, BSN

## 2017-06-23 NOTE — Telephone Encounter (Signed)
Henderson NewcomerMelissa Stenson at [facility] notified via [employer] that order has been placed in [employer] for [employer]. Kinnie FeilL. Azlan Hanway, RN, BSN

## 2017-06-26 NOTE — Telephone Encounter (Signed)
Thanks Lauren!

## 2017-06-26 NOTE — Telephone Encounter (Signed)
Order form for incontinence supplies faxed to [employer] at [phone]. Kinnie FeilL. Alani Sabbagh, RN, BSN

## 2017-07-02 NOTE — Telephone Encounter (Signed)
Pt received her adult diapers but didn't get the rolator.  Please advise

## 2017-07-06 ENCOUNTER — Telehealth: Payer: Self-pay | Admitting: Family Medicine

## 2017-07-06 NOTE — Telephone Encounter (Signed)
Message sent to Sheron NightingaleJason Pierce at [facility] to learn status of patient's rolator. Kinnie FeilL. Maylene Crocker, RN, BSN

## 2017-07-06 NOTE — Telephone Encounter (Signed)
Pt calling concerning her supplies she was supposed to be getting from [employer]. She's gotten everything but the walker. She's asking if it should be coming at a different time. Please advise

## 2017-07-14 NOTE — Telephone Encounter (Signed)
Pt received her walker.  She will be scheduling an appt towards the end of the month for all the test dr wants done. She is currently working on her mouth

## 2017-07-15 NOTE — Telephone Encounter (Signed)
Excellent!  Thanks for the update.  Daniel L. Myrtie SomanWarden, MD [facility] Resident PGY-2 07/15/2017 8:54 AM

## 2017-07-20 ENCOUNTER — Telehealth: Payer: Self-pay | Admitting: Family Medicine

## 2017-07-20 NOTE — Telephone Encounter (Signed)
Since the pt needs an in home health aide, her landlord needs a note from [place] stating that she needs this in home aide and why so that she can get a two bed room instead of the one bed one she has. That way the home aide has more room when she comes over and in case she needs to stay with the patient.  Patient also needs a shower chair for her bathroom. Please advise

## 2017-07-27 ENCOUNTER — Other Ambulatory Visit: Payer: Self-pay | Admitting: Family Medicine

## 2017-07-27 DIAGNOSIS — M1710 Unilateral primary osteoarthritis, unspecified knee: Secondary | ICD-10-CM

## 2017-07-27 DIAGNOSIS — M179 Osteoarthritis of knee, unspecified: Secondary | ICD-10-CM

## 2017-07-27 NOTE — Telephone Encounter (Signed)
Pt called to check the status the shower chair and a let her know right away. She has ask for last Monday 07/20/17 and has had no response and is really needing this chair. Please let her know what is going on. jw

## 2017-07-27 NOTE — Telephone Encounter (Signed)
Placed order for shower chair. Lauren, does advanced home care need a message?  Thanks, Rande Bruntaniel L. Myrtie SomanWarden, MD [facility] Resident PGY-2 07/27/2017 11:24 AM

## 2017-07-29 NOTE — Telephone Encounter (Signed)
[facility] is telling pt they never received the orders for shower chair. Please advise

## 2017-08-03 ENCOUNTER — Telehealth: Payer: Self-pay | Admitting: Family Medicine

## 2017-08-03 NOTE — Telephone Encounter (Signed)
[facility] told pt they hadnt received the orders for her to get her shower chair.  Please advise

## 2017-08-03 NOTE — Telephone Encounter (Signed)
Have sent community message to [facility]. Fleeger, Maryjo RochesterJessica Dawn, CMA

## 2017-08-06 ENCOUNTER — Telehealth: Payer: Self-pay | Admitting: Family Medicine

## 2017-08-06 NOTE — Telephone Encounter (Signed)
Patient is really needing her shower chair and has not heard back.  [phone] is best number to contact her, thanks.

## 2017-08-11 ENCOUNTER — Encounter: Payer: Self-pay | Admitting: *Deleted

## 2017-08-11 NOTE — Telephone Encounter (Signed)
This encounter was created in error - please disregard.

## 2017-08-11 NOTE — Telephone Encounter (Signed)
Rx for shower chair (Dx: mobility) and incontinence wipes #2 (Dx: incontinence) [facility] at [phone].  Patient informed.  Altamese Dilling~Perla Echavarria, BSN, RN-BC

## 2018-03-26 ENCOUNTER — Other Ambulatory Visit: Payer: Self-pay | Admitting: Family Medicine

## 2018-03-26 DIAGNOSIS — Z1231 Encounter for screening mammogram for malignant neoplasm of breast: Secondary | ICD-10-CM

## 2018-04-09 ENCOUNTER — Other Ambulatory Visit: Payer: Self-pay | Admitting: Family Medicine

## 2018-05-03 ENCOUNTER — Ambulatory Visit
Admission: RE | Admit: 2018-05-03 | Discharge: 2018-05-03 | Disposition: A | Discharge status: Home / Self Care | Payer: Medicaid Other | Source: Ambulatory Visit | Attending: Emergency Medicine | Admitting: Emergency Medicine

## 2018-05-03 DIAGNOSIS — Z1231 Encounter for screening mammogram for malignant neoplasm of breast: Secondary | ICD-10-CM

## 2018-10-07 ENCOUNTER — Ambulatory Visit (INDEPENDENT_AMBULATORY_CARE_PROVIDER_SITE_OTHER): Payer: Self-pay | Admitting: Orthopaedic Surgery

## 2018-12-13 ENCOUNTER — Other Ambulatory Visit (HOSPITAL_COMMUNITY)
Admission: RE | Admit: 2018-12-13 | Discharge: 2018-12-13 | Disposition: A | Discharge status: Home / Self Care | Payer: Medicaid Other | Source: Ambulatory Visit | Attending: Family Medicine | Admitting: Family Medicine

## 2018-12-13 ENCOUNTER — Other Ambulatory Visit: Payer: Self-pay

## 2018-12-13 ENCOUNTER — Ambulatory Visit (INDEPENDENT_AMBULATORY_CARE_PROVIDER_SITE_OTHER): Payer: Medicaid Other | Admitting: Family Medicine

## 2018-12-13 ENCOUNTER — Encounter: Payer: Self-pay | Admitting: Family Medicine

## 2018-12-13 VITALS — BP 120/64 | HR 75 | Ht 60.0 in | Wt 160.0 lb

## 2018-12-13 DIAGNOSIS — Z124 Encounter for screening for malignant neoplasm of cervix: Secondary | ICD-10-CM | POA: Diagnosis not present

## 2018-12-13 DIAGNOSIS — M1710 Unilateral primary osteoarthritis, unspecified knee: Secondary | ICD-10-CM | POA: Diagnosis not present

## 2018-12-13 DIAGNOSIS — Z1211 Encounter for screening for malignant neoplasm of colon: Secondary | ICD-10-CM | POA: Diagnosis not present

## 2018-12-13 DIAGNOSIS — D619 Aplastic anemia, unspecified: Secondary | ICD-10-CM

## 2018-12-13 DIAGNOSIS — N898 Other specified noninflammatory disorders of vagina: Secondary | ICD-10-CM | POA: Insufficient documentation

## 2018-12-13 DIAGNOSIS — Z113 Encounter for screening for infections with a predominantly sexual mode of transmission: Secondary | ICD-10-CM | POA: Insufficient documentation

## 2018-12-13 DIAGNOSIS — R944 Abnormal results of kidney function studies: Secondary | ICD-10-CM | POA: Diagnosis not present

## 2018-12-13 DIAGNOSIS — M179 Osteoarthritis of knee, unspecified: Secondary | ICD-10-CM

## 2018-12-13 HISTORY — DX: Other specified noninflammatory disorders of vagina: N89.8

## 2018-12-13 HISTORY — DX: Encounter for screening for malignant neoplasm of colon: Z12.11

## 2018-12-13 LAB — POCT WET PREP (WET MOUNT)
Clue Cells Wet Prep Whiff POC: NEGATIVE
Trichomonas Wet Prep HPF POC: ABSENT

## 2018-12-13 NOTE — Patient Instructions (Signed)
Was great to meet you today!  We did a lot of screening for you.  We will call you regarding the results of the tests that we ran in clinic today.  I will send a message regarding the diapers and wipes that we talked about and I put in an order for your walker to be replaced.

## 2018-12-13 NOTE — Assessment & Plan Note (Addendum)
Pelvic exam showed significant thick white vaginal discharge.  Wet prep in clinic showed no evidence of BV or yeast infection. -Follow-up GC/chlamydia -Follow-up HIV, RPR, hep C -Consider treatment for BV if above testing is unremarkable

## 2018-12-13 NOTE — Assessment & Plan Note (Signed)
-  Follow-up Pap smear 

## 2018-12-13 NOTE — Assessment & Plan Note (Signed)
-  Outpatient referral for colonoscopy -Would recommend upper scope as well due to above-mentioned hematemesis

## 2018-12-13 NOTE — Progress Notes (Signed)
    Subjective:  Ruth Gardner is a 58 y.o. female who presents to the [facility] today with presented for a physical exam today also to discuss vaginal discharge.   HPI: Concern for hematemesis, Hx of GERD She reports she has been experiencing significant nausea for the past month.  This typically happens closer to bedtime she frequently experiences nausea with emesis after taking her nighttime medication.  She is reported several occasions where she has seen black, coffee-ground like emesis.  She is not currently endorsing any stomach pain.  She denies hematochezia or melena.  She denies any history of alcohol use or taking recent Pepto-Bismol.  She is not currently taking any iron supplements.  Vaginal discharge   She reports malodorous vaginal discharge for roughly 1 month.  She reports thick white discharge.  She denies abdominal pain, fever.  She is sexually active and would like to be tested for STIs.  Health maintenance She was informed that from her age and sex she is not due for screening for cervical cancer and colon cancer.  She is agreeable and elected to move forward with both screenings.     Chief Complaint noted Review of Symptoms - see HPI PMH -she denies smoking, drinking.  Objective:  Physical Exam: BP 120/64   Pulse 75   Ht 5' (1.524 m)   Wt 160 lb (72.6 kg)   LMP 10/14/2014 (Approximate)   SpO2 98%   BMI 31.25 kg/m    Gen: NAD, resting comfortably.  In clear discomfort moving around the room, stepping up to the table moving around during the pelvic exam. CV: RRR with no murmurs appreciated Pulm: NWOB, CTAB with no crackles, wheezes, or rhonchi GI: Normal bowel sounds present. Soft, Nontender, Nondistended.  Mild tenderness to palpation of the epigastric region. Skin: warm, dry Neuro: grossly normal, moves all extremities Psych: Normal affect and thought content  Results for orders placed or performed in visit on 12/13/18 (from the past 72 hour(s))  POCT Wet  Prep [facility] [place])     Status: Abnormal   Collection Time: 12/13/18 10:00 AM  Result Value Ref Range   Source Wet Prep POC VAG    WBC, Wet Prep HPF POC 1-5    Bacteria Wet Prep HPF POC Moderate (A) Few   Clue Cells Wet Prep HPF POC None None   Clue Cells Wet Prep Whiff POC Negative Whiff    Yeast Wet Prep HPF POC None None   Trichomonas Wet Prep HPF POC Absent Absent     Assessment/Plan:  GERD (gastroesophageal reflux disease) She reports that she has been taking her omeprazole at home daily.  There is some concern regarding hematemesis.  Abdominal exam shows only mild tenderness to palpation of the epigastric region.  Differential primarily concerning for an upper GI bleed.  Low suspicion for true GI bleed at this time.  Vitals are within normal limits, no significant skin pallor conjunctival pallor.  She will be following up with GI for colonoscopy, will encourage upper scope at that time. -Follow-up CBC -follow up BMP   Vaginal discharge Pelvic exam showed significant thick white vaginal discharge.  Wet prep in clinic showed no evidence of BV or yeast infection. -Follow-up GC/chlamydia -Follow-up HIV, RPR, hep C -Consider treatment for BV if above testing is unremarkable  Screening for malignant neoplasm of cervix -Follow-up Pap smear  Screening for colon cancer -Outpatient referral for colonoscopy -Would recommend upper scope as well due to above-mentioned hematemesis

## 2018-12-13 NOTE — Assessment & Plan Note (Addendum)
She reports that she has been taking her omeprazole at home daily.  There is some concern regarding hematemesis.  Abdominal exam shows only mild tenderness to palpation of the epigastric region.  Differential primarily concerning for an upper GI bleed.  Low suspicion for true GI bleed at this time.  Vitals are within normal limits, no significant skin pallor conjunctival pallor.  She will be following up with GI for colonoscopy, will encourage upper scope at that time. -Follow-up CBC -follow up BMP

## 2018-12-14 LAB — CBC WITH DIFFERENTIAL/PLATELET
Basophils Absolute: 0 10*3/uL (ref 0.0–0.2)
Basos: 1 %
EOS (ABSOLUTE): 0.2 10*3/uL (ref 0.0–0.4)
Eos: 2 %
Hematocrit: 37.8 % (ref 34.0–46.6)
Hemoglobin: 12.5 g/dL (ref 11.1–15.9)
Immature Grans (Abs): 0 10*3/uL (ref 0.0–0.1)
Immature Granulocytes: 0 %
Lymphocytes Absolute: 3.4 10*3/uL — ABNORMAL HIGH (ref 0.7–3.1)
Lymphs: 41 %
MCH: 27.6 pg (ref 26.6–33.0)
MCHC: 33.1 g/dL (ref 31.5–35.7)
MCV: 83 fL (ref 79–97)
Monocytes Absolute: 0.6 10*3/uL (ref 0.1–0.9)
Monocytes: 7 %
Neutrophils Absolute: 4.2 10*3/uL (ref 1.4–7.0)
Neutrophils: 49 %
Platelets: 350 10*3/uL (ref 150–450)
RBC: 4.53 x10E6/uL (ref 3.77–5.28)
RDW: 13.3 % (ref 11.7–15.4)
WBC: 8.3 10*3/uL (ref 3.4–10.8)

## 2018-12-14 LAB — BASIC METABOLIC PANEL
BUN/Creatinine Ratio: 14 (ref 9–23)
BUN: 16 mg/dL (ref 6–24)
CO2: 23 mmol/L (ref 20–29)
Calcium: 9.6 mg/dL (ref 8.7–10.2)
Chloride: 105 mmol/L (ref 96–106)
Creatinine, Ser: 1.11 mg/dL — ABNORMAL HIGH (ref 0.57–1.00)
GFR calc Af Amer: 64 mL/min/{1.73_m2} (ref >59)
GFR calc non Af Amer: 56 mL/min/{1.73_m2} — ABNORMAL LOW (ref >59)
Glucose: 95 mg/dL (ref 65–99)
Potassium: 4.3 mmol/L (ref 3.5–5.2)
Sodium: 142 mmol/L (ref 134–144)

## 2018-12-14 LAB — HEPATITIS C ANTIBODY: Hep C Virus Ab: 0.2 s/co ratio (ref 0.0–0.9)

## 2018-12-14 LAB — HIV ANTIBODY (ROUTINE TESTING W REFLEX): HIV Screen 4th Generation wRfx: NONREACTIVE

## 2018-12-14 LAB — RPR: RPR Ser Ql: NONREACTIVE

## 2018-12-16 ENCOUNTER — Telehealth: Payer: Self-pay | Admitting: Family Medicine

## 2018-12-16 NOTE — Telephone Encounter (Signed)
Wonderful. Thank you for the update.

## 2018-12-16 NOTE — Telephone Encounter (Signed)
Pt was seen by Dr. Pilar Plate on 12/13/18 and called to let him know that she was able to receive the chair that was ordered.

## 2018-12-16 NOTE — Telephone Encounter (Signed)
FYI.  Anandi Abramo,CMA  

## 2018-12-17 ENCOUNTER — Other Ambulatory Visit: Payer: Self-pay | Admitting: Family Medicine

## 2018-12-17 LAB — CYTOLOGY - PAP
Adequacy: ABSENT
Chlamydia: NEGATIVE
Diagnosis: NEGATIVE
HPV: NOT DETECTED
Neisseria Gonorrhea: NEGATIVE

## 2018-12-17 MED ORDER — METRONIDAZOLE 500 MG PO TABS: 500.0000 mg | ORAL_TABLET | Freq: Two times a day (BID) | ORAL | 0 refills | Status: DC

## 2018-12-17 NOTE — Progress Notes (Signed)
Spoke with pt over the phone.  Informed pt that testing so far has been unremarkable but we are still waiting for her PAP results.  She was informed that we did not do tests for gonorrhea or chlamydia in clinic (an oversight).  We will treat her vaginal discharge as BV and move forward with a 7 day course of metronidazole.    She was told to return to clinic if she does not experience improvement in one week or if she has worsened symptoms.  If she returns to clinic, testing for gonorrhea/chlamydia needs to be done.  -Metronidazole 500mg  BID 7 days sent to pharmacy  Matilde Haymaker, MD

## 2018-12-17 NOTE — Telephone Encounter (Signed)
Will forward to MD. Clarinda Obi,CMA  

## 2018-12-17 NOTE — Telephone Encounter (Signed)
Pt calling back because she said she did receive the chair, but she says she still has not received the Pamper adult diapers and wipes. Pt would like to know the status of this order.

## 2018-12-20 ENCOUNTER — Other Ambulatory Visit: Payer: Self-pay | Admitting: Family Medicine

## 2018-12-20 DIAGNOSIS — F09 Unspecified mental disorder due to known physiological condition: Secondary | ICD-10-CM

## 2018-12-20 DIAGNOSIS — N3941 Urge incontinence: Secondary | ICD-10-CM

## 2018-12-20 NOTE — Progress Notes (Signed)
Incontinence supplies ordered, unclear etiology, should have further imaging for this.   Dorris Singh, MD  Family Medicine Teaching Service

## 2018-12-20 NOTE — Telephone Encounter (Signed)
Pt is returning Dr. Baxter Flattery call. Informed her of below. She wants to be sure that the Adult diapers and wipes were both ordered. She has to have the wipes as well

## 2018-12-20 NOTE — Telephone Encounter (Signed)
Will forward to MD to add wipes to this order.  Yoshie Kosel,CMA

## 2018-12-20 NOTE — Telephone Encounter (Signed)
Pt calling to check status of wipes and adult diapers. Christen Bame, CMA

## 2018-12-20 NOTE — Telephone Encounter (Signed)
Tried to call, no answer.  Please inform her that we just put in a new order for adult diapers.  Please call back if you have not heard anything in 2-3 days.  I'm sorry for the inconvenience.  Matilde Haymaker, MD

## 2018-12-24 ENCOUNTER — Other Ambulatory Visit: Payer: Self-pay | Admitting: Family Medicine

## 2018-12-24 NOTE — Telephone Encounter (Signed)
Orders were placed but never sent anywhere.    Received call from [employer] will print and fax orders for wipes and diapers now to [phone].  Christen Bame, CMA

## 2018-12-27 NOTE — Telephone Encounter (Signed)
Received call from pt.  [employer] has not received the orders.  Faxed again to previous number and new number given by Delilah Shan which is [phone].  Pt informed. Christen Bame, CMA

## 2019-01-14 ENCOUNTER — Telehealth: Payer: Self-pay | Admitting: *Deleted

## 2019-01-14 NOTE — Telephone Encounter (Signed)
Pt would like to know her results from 7/6. Christen Bame, CMA

## 2019-01-15 NOTE — Telephone Encounter (Signed)
Please call pt and relay the following information. The majority of her labs were previously discussed over the phone. Overall, her labs were unremarkable.  She is showing a mildly worsening kidney function that does not need any further testing at this time (may be age related or possibly only a temporary worsening that will recover, not concerning at present).  Her PAP smear was negative for signs of cancer so she does not need another PAP for 5 years.  Matilde Haymaker, MD

## 2019-01-17 NOTE — Telephone Encounter (Signed)
Pt informed.  Ruth Gardner, CMA  

## 2019-03-11 ENCOUNTER — Other Ambulatory Visit: Payer: Self-pay | Admitting: Family Medicine

## 2019-03-11 DIAGNOSIS — Z1231 Encounter for screening mammogram for malignant neoplasm of breast: Secondary | ICD-10-CM

## 2019-03-14 ENCOUNTER — Encounter: Payer: Self-pay | Admitting: Gastroenterology

## 2019-03-17 ENCOUNTER — Ambulatory Visit: Payer: Medicaid Other | Admitting: Family Medicine

## 2019-03-28 ENCOUNTER — Encounter: Payer: Self-pay | Admitting: Family Medicine

## 2019-03-28 ENCOUNTER — Ambulatory Visit (INDEPENDENT_AMBULATORY_CARE_PROVIDER_SITE_OTHER): Payer: Medicaid Other | Admitting: Family Medicine

## 2019-03-28 ENCOUNTER — Other Ambulatory Visit: Payer: Self-pay

## 2019-03-28 ENCOUNTER — Other Ambulatory Visit (HOSPITAL_COMMUNITY)
Admission: RE | Admit: 2019-03-28 | Discharge: 2019-03-28 | Disposition: A | Discharge status: Home / Self Care | Payer: Medicaid Other | Source: Ambulatory Visit | Attending: Family Medicine | Admitting: Family Medicine

## 2019-03-28 VITALS — BP 110/70 | HR 75 | Ht 60.0 in | Wt 167.0 lb

## 2019-03-28 DIAGNOSIS — Z0000 Encounter for general adult medical examination without abnormal findings: Secondary | ICD-10-CM

## 2019-03-28 DIAGNOSIS — N760 Acute vaginitis: Secondary | ICD-10-CM

## 2019-03-28 DIAGNOSIS — B9689 Other specified bacterial agents as the cause of diseases classified elsewhere: Secondary | ICD-10-CM | POA: Insufficient documentation

## 2019-03-28 DIAGNOSIS — K920 Hematemesis: Secondary | ICD-10-CM | POA: Insufficient documentation

## 2019-03-28 DIAGNOSIS — Z113 Encounter for screening for infections with a predominantly sexual mode of transmission: Secondary | ICD-10-CM | POA: Diagnosis not present

## 2019-03-28 HISTORY — DX: Acute vaginitis: B96.89

## 2019-03-28 HISTORY — DX: Hematemesis: K92.0

## 2019-03-28 LAB — POCT WET PREP (WET MOUNT)
Clue Cells Wet Prep Whiff POC: POSITIVE
Trichomonas Wet Prep HPF POC: ABSENT

## 2019-03-28 MED ORDER — METRONIDAZOLE 500 MG PO TABS: 500.0000 mg | ORAL_TABLET | Freq: Two times a day (BID) | ORAL | 0 refills | Status: AC

## 2019-03-28 NOTE — Assessment & Plan Note (Signed)
Wet prep positive for bacterial vaginosis.  Will treat patient with 7-day course of metronidazole.  Patient is aware of results.  Pap smear was completed on 12/2018 and was normal.  Hep C, GC/chlamydia, HIV, RPR obtained.   Patient is to return to clinic as she does not receive any benefit from treatment or if symptoms worsen in the next week. -Prescribed metronidazole 500 mg twice daily for 7 days

## 2019-03-28 NOTE — Assessment & Plan Note (Addendum)
Appointment set for Nov. 9th with GI.  Advised patient to keep this appointment.  Repeat CBC and Hep C.  Patient needs colonoscopy/endoscopy.

## 2019-03-28 NOTE — Patient Instructions (Signed)
a 

## 2019-03-28 NOTE — Progress Notes (Signed)
    Subjective:  Quaniyah Bugh is a 58 y.o. female who presents to the [facility] today with a chief complaint of STI screening.     HPI: Leeandra Ellerson presents for STI testing.    Vaginal Discharge Pt reports ongoing white malodorous vaginal discharge for the past three weeks. Patient has had similar sx in the past when she was treated for BV. Had a PAP in the last six months. Denies pelvic pain, vaginal bleeding, dyspareunia, dysuria, hematuria, fever, abdominal pain and flank pain. Patient denies any new sexual partners.   Vomiting Patient with ongoing chalky black emesis. Denies rectal bleeding, hematochezia, melena, abdominal pain and fatigue.   Bosque, medications and smoking status reviewed.   ROS: See HPI     Objective:  Physical Exam: BP 110/70   Pulse 75   Ht 5' (1.524 m)   Wt 167 lb (75.8 kg)   LMP 10/14/2014 (Approximate)   SpO2 97%   BMI 32.61 kg/m    GEN: pleasant, in no acute distress, using rollator CV: good capillary refill RESP: no increased work of breathing GU: Pelvic exam: normal external genitalia, vulva, vagina, cervix, uterus and adnexa, CERVIX: normal appearing cervix without discharge or lesions, lesions absent, no discharge noted, WET MOUNT done - results: KOH done, clue cells. MSK: no lower extremity edema SKIN: warm, dry  Results for orders placed or performed in visit on 03/28/19 (from the past 72 hour(s))  POCT Wet Prep [facility])     Status: Abnormal   Collection Time: 03/28/19 11:35 AM  Result Value Ref Range   Source Wet Prep POC VAG    WBC, Wet Prep HPF POC NONE    Bacteria Wet Prep HPF POC Moderate (A) Few   Clue Cells Wet Prep HPF POC Few (A) None   Clue Cells Wet Prep Whiff POC Positive Whiff    Yeast Wet Prep HPF POC None None   Trichomonas Wet Prep HPF POC Absent Absent     Assessment/Plan:  Bacterial vaginitis Wet prep positive for bacterial vaginosis.  Will treat patient with 7-day course of metronidazole.  Patient is  aware of results.  Pap smear was completed on 12/2018 and was normal.  Hep C, GC/chlamydia, HIV, RPR obtained.   Patient is to return to clinic as she does not receive any benefit from treatment or if symptoms worsen in the next week. -Prescribed metronidazole 500 mg twice daily for 7 days     Hematemesis Appointment set for Nov. 9th with GI.  Advised patient to keep this appointment.  Repeat CBC and Hep C.  Patient needs colonoscopy/endoscopy.      Orders Placed This Encounter  Procedures  . RPR  . HIV antibody (with reflex)  . CBC  . Hepatitis c antibody (reflex)  . POCT Wet Prep [facility])    Meds ordered this encounter  Medications  . metroNIDAZOLE (FLAGYL) 500 MG tablet    Sig: Take 1 tablet (500 mg total) by mouth 2 (two) times daily for 7 days.    Dispense:  14 tablet    Refill:  0    Health Maintenance reviewed - PAP UTD.   Lyndee Hensen, DO PGY-1, [facility] 03/28/2019 12:01 PM

## 2019-03-29 LAB — RPR: RPR Ser Ql: NONREACTIVE

## 2019-03-29 LAB — CBC
Hematocrit: 40.5 % (ref 34.0–46.6)
Hemoglobin: 13.3 g/dL (ref 11.1–15.9)
MCH: 27.5 pg (ref 26.6–33.0)
MCHC: 32.8 g/dL (ref 31.5–35.7)
MCV: 84 fL (ref 79–97)
Platelets: 323 10*3/uL (ref 150–450)
RBC: 4.84 x10E6/uL (ref 3.77–5.28)
RDW: 13.4 % (ref 11.7–15.4)
WBC: 8.6 10*3/uL (ref 3.4–10.8)

## 2019-03-29 LAB — HIV ANTIBODY (ROUTINE TESTING W REFLEX): HIV Screen 4th Generation wRfx: NONREACTIVE

## 2019-03-29 LAB — HEPATITIS C ANTIBODY (REFLEX): HCV Ab: <0.1 s/co ratio (ref 0.0–0.9)

## 2019-03-29 LAB — HCV COMMENT:

## 2019-03-31 LAB — CERVICOVAGINAL ANCILLARY ONLY
Chlamydia: NEGATIVE
Comment: NEGATIVE
Comment: NORMAL
Neisseria Gonorrhea: NEGATIVE

## 2019-04-07 ENCOUNTER — Telehealth: Payer: Self-pay | Admitting: Family Medicine

## 2019-04-07 ENCOUNTER — Other Ambulatory Visit: Payer: Self-pay | Admitting: Family Medicine

## 2019-04-07 ENCOUNTER — Encounter: Payer: Self-pay | Admitting: Family Medicine

## 2019-04-07 NOTE — Telephone Encounter (Signed)
Late entry, called and left voicemail regarding patient's normal blood work. Hep C, HIV and RPR negative.

## 2019-05-02 ENCOUNTER — Encounter: Payer: Medicaid Other | Admitting: Gastroenterology

## 2019-05-09 ENCOUNTER — Ambulatory Visit: Payer: Medicaid Other

## 2019-05-25 ENCOUNTER — Ambulatory Visit: Payer: Medicaid Other

## 2019-07-19 ENCOUNTER — Other Ambulatory Visit: Payer: Self-pay

## 2019-07-19 ENCOUNTER — Ambulatory Visit
Admission: RE | Admit: 2019-07-19 | Discharge: 2019-07-19 | Disposition: A | Discharge status: Home / Self Care | Payer: Medicaid Other | Source: Ambulatory Visit | Attending: Family Medicine | Admitting: Family Medicine

## 2019-07-19 DIAGNOSIS — Z1231 Encounter for screening mammogram for malignant neoplasm of breast: Secondary | ICD-10-CM

## 2019-08-04 ENCOUNTER — Other Ambulatory Visit: Payer: Self-pay

## 2019-08-04 ENCOUNTER — Other Ambulatory Visit (HOSPITAL_COMMUNITY)
Admission: RE | Admit: 2019-08-04 | Discharge: 2019-08-04 | Disposition: A | Discharge status: Home / Self Care | Payer: Medicaid Other | Source: Ambulatory Visit | Attending: Family Medicine | Admitting: Family Medicine

## 2019-08-04 ENCOUNTER — Encounter: Payer: Self-pay | Admitting: Family Medicine

## 2019-08-04 ENCOUNTER — Ambulatory Visit (INDEPENDENT_AMBULATORY_CARE_PROVIDER_SITE_OTHER): Payer: Medicaid Other | Admitting: Family Medicine

## 2019-08-04 ENCOUNTER — Encounter: Payer: Self-pay | Admitting: Gastroenterology

## 2019-08-04 VITALS — BP 110/66 | HR 78 | Ht 60.0 in | Wt 169.5 lb

## 2019-08-04 DIAGNOSIS — N898 Other specified noninflammatory disorders of vagina: Secondary | ICD-10-CM

## 2019-08-04 DIAGNOSIS — Z9189 Other specified personal risk factors, not elsewhere classified: Secondary | ICD-10-CM | POA: Diagnosis not present

## 2019-08-04 DIAGNOSIS — N3946 Mixed incontinence: Secondary | ICD-10-CM

## 2019-08-04 DIAGNOSIS — Z0000 Encounter for general adult medical examination without abnormal findings: Secondary | ICD-10-CM | POA: Diagnosis not present

## 2019-08-04 LAB — POCT WET PREP (WET MOUNT)
Clue Cells Wet Prep Whiff POC: NEGATIVE
Trichomonas Wet Prep HPF POC: ABSENT

## 2019-08-04 MED ORDER — SHINGRIX 50 MCG/0.5ML IM SUSR: 0.5000 mL | Freq: Once | INTRAMUSCULAR | 0 refills | Status: AC

## 2019-08-04 NOTE — Progress Notes (Signed)
    Subjective:  Ruth Gardner is a 59 y.o. female who presents to the [facility] today with a chief complaint of vaginal odor .   HPI:   Incontinence  Doesn't make it to the bathroom.  "Not fast enough" to get to the bathroom. Reports urinary and bowel incontinence. Has to change about 7-8 times a day.  Has been occurring since before 2017.  Unable to hold urine in before she gets to the bathroom.  On occasion voids without her knowledge. Likely bad back and knee pain. Is supposed to have bilateral knee surgery but was told to wait because of the pandemic.   Vaginal odor   Fishy odor, vaginal discharge that is white. Hx of BV. Came back last week.  Denies vaginal bleeding, dyspurenia,   Condom broke 3 weeks ago with her partner.       Objective:  Physical Exam: BP 110/66   Pulse 78   Ht 5' (1.524 m)   Wt 169 lb 8 oz (76.9 kg)   LMP 10/14/2014 (Approximate)   SpO2 98%   BMI 33.10 kg/m     GEN: pleasant female in no acute distress  CV: regular rate and rhythm, no murmurs appreciated  RESP: no increased work of breathing, clear to ascultation bilaterally ABD:  Soft, Nontender, Nondistended.  MSK: using Rolator  Pelvic exam: VULVA: normal appearing vulva with no masses, tenderness or lesions, VAGINA: vaginal discharge - white and thin, CERVIX: normal appearing cervix without discharge or lesions, WET MOUNT done - results: excessive bacteria, no clue cells, negative Whiff test, no yeast, no trich, exam chaperoned by CMA.     No results found for this or any previous visit (from the past 72 hour(s)).   Assessment/Plan:  Mixed incontinence Overflow vs urge vs stress incontinence. Patient has impaired mobility. Reported bowel and bladder incontinence. Can occur with / without coughing and sneezing.  - consider Myrbetriq for urge incontinence  - consider post void residual and UA at next visit    Vaginal discharge Pelvic exam with many bacteria however not indicative of BV,  trichomoniasis or yeast infection.  GC / CT pending.  RPR / HIV pending.  Will not treat at this time. Return precautions discussed with patient. Good hygiene practices reviewed.   - Follow up if symptoms worsen or are not improving  - Follow up GC /CT  - Follow up HIV / RPR     Orders Placed This Encounter  Procedures  . RPR  . HIV antibody (with reflex)  . Ambulatory referral to Gastroenterology    Referral Priority:   Routine    Referral Type:   Consultation    Referral Reason:   Specialty Services Required    Number of Visits Requested:   1  . POCT Wet Prep [facility])    Meds ordered this encounter  Medications  . Zoster Vaccine Adjuvanted [facility]) injection    Sig: Inject 0.5 mLs into the muscle once for 1 dose.    Dispense:  0.5 mL    Refill:  0    Health Maintenance reviewed -GI referral for colonoscopy placed.  Td needed and Shingles vaccine Rx sent to pharmacy.   Katha Cabal, DO PGY-1, [facility] 08/04/2019 10:52 AM

## 2019-08-04 NOTE — Patient Instructions (Signed)
It was great seeing you today!   I'd like to see you back 6 months but if you need to be seen earlier than that for any new issues we're happy to fit you in, just give [place] a call!  - Stop by the pharmacy to get your prescriptions     If you have questions or concerns please do not hesitate to call at [phone].  Dr. Katherina [facility]

## 2019-08-05 LAB — CERVICOVAGINAL ANCILLARY ONLY
Chlamydia: NEGATIVE
Comment: NEGATIVE
Comment: NORMAL
Neisseria Gonorrhea: NEGATIVE

## 2019-08-05 LAB — HIV ANTIBODY (ROUTINE TESTING W REFLEX): HIV Screen 4th Generation wRfx: NONREACTIVE

## 2019-08-05 LAB — RPR: RPR Ser Ql: NONREACTIVE

## 2019-08-08 DIAGNOSIS — N3946 Mixed incontinence: Secondary | ICD-10-CM | POA: Insufficient documentation

## 2019-08-08 NOTE — Assessment & Plan Note (Signed)
Overflow vs urge vs stress incontinence. Patient has impaired mobility. Reported bowel and bladder incontinence. Can occur with / without coughing and sneezing.  - consider Myrbetriq for urge incontinence  - consider post void residual and UA at next visit

## 2019-08-08 NOTE — Assessment & Plan Note (Addendum)
Pelvic exam with many bacteria however not indicative of BV, trichomoniasis or yeast infection.  GC / CT pending.  RPR / HIV pending.  Will not treat at this time. Return precautions discussed with patient. Good hygiene practices reviewed.   - Follow up if symptoms worsen or are not improving  - Follow up GC /CT  - Follow up HIV / RPR

## 2019-09-07 ENCOUNTER — Encounter: Payer: Medicaid Other | Admitting: Gastroenterology

## 2019-09-19 ENCOUNTER — Encounter: Payer: Self-pay | Admitting: Gastroenterology

## 2019-11-01 ENCOUNTER — Encounter: Payer: Medicaid Other | Admitting: Gastroenterology

## 2019-12-08 ENCOUNTER — Other Ambulatory Visit: Payer: Self-pay | Admitting: Family Medicine

## 2019-12-22 ENCOUNTER — Encounter: Payer: Self-pay | Admitting: Family Medicine

## 2019-12-22 ENCOUNTER — Ambulatory Visit (INDEPENDENT_AMBULATORY_CARE_PROVIDER_SITE_OTHER): Payer: Medicaid Other | Admitting: Family Medicine

## 2019-12-22 ENCOUNTER — Other Ambulatory Visit: Payer: Self-pay

## 2019-12-22 ENCOUNTER — Other Ambulatory Visit (HOSPITAL_COMMUNITY)
Admission: RE | Admit: 2019-12-22 | Discharge: 2019-12-22 | Disposition: A | Discharge status: Home / Self Care | Payer: Medicaid Other | Source: Ambulatory Visit | Attending: Family Medicine | Admitting: Family Medicine

## 2019-12-22 VITALS — BP 108/70 | HR 79 | Ht 60.0 in | Wt 171.1 lb

## 2019-12-22 DIAGNOSIS — B9689 Other specified bacterial agents as the cause of diseases classified elsewhere: Secondary | ICD-10-CM | POA: Diagnosis not present

## 2019-12-22 DIAGNOSIS — R300 Dysuria: Secondary | ICD-10-CM | POA: Diagnosis not present

## 2019-12-22 DIAGNOSIS — N760 Acute vaginitis: Secondary | ICD-10-CM | POA: Diagnosis present

## 2019-12-22 DIAGNOSIS — N898 Other specified noninflammatory disorders of vagina: Secondary | ICD-10-CM | POA: Insufficient documentation

## 2019-12-22 DIAGNOSIS — Z7251 High risk heterosexual behavior: Secondary | ICD-10-CM

## 2019-12-22 DIAGNOSIS — K219 Gastro-esophageal reflux disease without esophagitis: Secondary | ICD-10-CM | POA: Diagnosis not present

## 2019-12-22 LAB — POCT WET PREP (WET MOUNT)
Clue Cells Wet Prep Whiff POC: POSITIVE
Trichomonas Wet Prep HPF POC: ABSENT

## 2019-12-22 LAB — POCT URINALYSIS DIP (MANUAL ENTRY)
Bilirubin, UA: NEGATIVE
Glucose, UA: NEGATIVE mg/dL
Ketones, POC UA: NEGATIVE mg/dL
Nitrite, UA: NEGATIVE
Protein Ur, POC: >=300 mg/dL — AB
Spec Grav, UA: >=1.03 — AB (ref 1.010–1.025)
Urobilinogen, UA: 0.2 E.U./dL
pH, UA: 5.5 (ref 5.0–8.0)

## 2019-12-22 LAB — POCT UA - MICROSCOPIC ONLY: Epithelial cells, urine per micros: >20

## 2019-12-22 MED ORDER — CEPHALEXIN 500 MG PO CAPS: 500.0000 mg | ORAL_CAPSULE | Freq: Four times a day (QID) | ORAL | 0 refills | Status: AC

## 2019-12-22 MED ORDER — METRONIDAZOLE 500 MG PO TABS: 500.0000 mg | ORAL_TABLET | Freq: Two times a day (BID) | ORAL | 0 refills | Status: AC

## 2019-12-22 MED ORDER — OMEPRAZOLE 20 MG PO CPDR: 20.0000 mg | DELAYED_RELEASE_CAPSULE | Freq: Every day | ORAL | 2 refills | Status: DC

## 2019-12-22 NOTE — Patient Instructions (Addendum)
It was great seeing you today!   I'd like to but if you need to be seen earlier than that for any new issues we're happy to fit you in, just give [place] a call!  - You will get a phone call about your test results  -  Take the antibiotic (Keflex) four times a day  - Take the Flagyl (Metronidazole) two times a day  - Refilled your acid reflux medication today  - Pick these medications up from your pharmacy     If you have questions or concerns please do not hesitate to call at [phone].  Dr. Katherina [facility]

## 2019-12-22 NOTE — Progress Notes (Signed)
Called and spoke with patient regarding her urine testing completed today.  Instructed patient not to take antibiotics that were prescribed during today's encounter. Patient expressed understanding an will not take this medication.

## 2019-12-22 NOTE — Assessment & Plan Note (Deleted)
Treat with Keflex 500 mg TID for 5 days

## 2019-12-22 NOTE — Progress Notes (Addendum)
    SUBJECTIVE:   CHIEF COMPLAINT / HPI:   Vaginal Discharge Having vaginal discharge for 2 weeks ago. Malodorous, vaginal irritation, itching and burning, low back pain, pelvic and abdominal pain.  Discharge consistency: thick Discharge color: white  Medications tried: none   No recent antibiotic use  - Denies hematuria, nausea or vomiting, fevers  - Patient reports Gonorrhea, Chlamydia, Trichomoniasis, Syphilis  - Sexully active with one female partner.  - Patient's last menstrual period was 10/14/2014 (approximate).  - Patient reports BV, yeast in the past.  - Patient denies douching -Contraception: Nothing  Possible STD exposure: yes.   Symptoms Fever: no  Dysuria: yes Vaginal bleeding: no Abdomen or Pelvic pain: both Back pain: yes  Genital sores or ulcers: no  Rash: no Pain during sex: yes   Dysuria Patient with decreased mobility secondary to right knee pain. Patient with urinary frequency and urgency as well as dysuria for the past 2-3 days.      ROS see HPI   PERTINENT  PMH / PSH: smoking status noted  OBJECTIVE:   BP 108/70   Pulse 79   Ht 5' (1.524 m)   Wt 171 lb 2 oz (77.6 kg)   LMP 10/14/2014 (Approximate)   SpO2 99%   BMI 33.42 kg/m    GEN: well appearing female in no acute distress  CVS: well perfused  RESP: speaking in full sentences without pause  ABD: soft, non-tender, non-distended, no palpable masses  Pelvic exam: normal external genitalia, vulva, VAGINA and CERVIX: lesions absent,  discharge present - copious and green, ADNEXA: normal adnexa in size, nontender and no masses, WET MOUNT done - results: KOH done, clue cells, excessive bacteria, trichomonads absent, exam chaperoned by CMA.     ASSESSMENT/PLAN:   Bacterial vaginitis BV (bacterial vaginosis)  Confirmed on wet prep. G/C Percell Locus is pending. Symptoms consistent with this.  - Treatment: Flagyl 500 BID x 7 days and abstain from coitus during course of treatment. Advised  patient to not drink alcohol while taking this medication.  - F/U if symptoms not improving or getting worse.  - Will f/u on G/C Chlamydia and call in Rx if positive.  - Self care instructions given including avoiding douching. Handout given.  - F/U with PCP as needed.  - Return precautions including abdominal pain, fever, chills, nausea, or vomiting given.   High risk heterosexual behavior GC and chlamydia DNA  probe sent to lab. HIV and RPR collected. Advised patient to use barrier protection/condoms.       Katha Cabal, DO [facility]

## 2019-12-22 NOTE — Assessment & Plan Note (Addendum)
BV (bacterial vaginosis)  Confirmed on wet prep. G/C Percell Locus is pending. Symptoms consistent with this.  - Treatment: Flagyl 500 BID x 7 days and abstain from coitus during course of treatment. Advised patient to not drink alcohol while taking this medication.  - F/U if symptoms not improving or getting worse.  - Will f/u on G/C Chlamydia and call in Rx if positive.  - Self care instructions given including avoiding douching. Handout given.  - F/U with PCP as needed.  - Return precautions including abdominal pain, fever, chills, nausea, or vomiting given.   High risk heterosexual behavior GC and chlamydia DNA  probe sent to lab. HIV and RPR collected. Advised patient to use barrier protection/condoms.

## 2019-12-23 LAB — CERVICOVAGINAL ANCILLARY ONLY
Chlamydia: NEGATIVE
Comment: NEGATIVE
Comment: NORMAL
Neisseria Gonorrhea: NEGATIVE

## 2019-12-23 LAB — RPR: RPR Ser Ql: NONREACTIVE

## 2019-12-23 LAB — HIV ANTIBODY (ROUTINE TESTING W REFLEX): HIV Screen 4th Generation wRfx: NONREACTIVE

## 2020-03-10 ENCOUNTER — Other Ambulatory Visit: Payer: Self-pay

## 2020-03-10 ENCOUNTER — Encounter (HOSPITAL_COMMUNITY): Payer: Self-pay | Admitting: Emergency Medicine

## 2020-03-10 ENCOUNTER — Emergency Department (HOSPITAL_COMMUNITY): Payer: Medicaid Other

## 2020-03-10 ENCOUNTER — Emergency Department (HOSPITAL_COMMUNITY)
Admission: EM | Admit: 2020-03-10 | Discharge: 2020-03-11 | Disposition: A | Discharge status: Home / Self Care | Payer: Medicaid Other | Attending: Emergency Medicine | Admitting: Emergency Medicine

## 2020-03-10 DIAGNOSIS — S0512XA Contusion of eyeball and orbital tissues, left eye, initial encounter: Secondary | ICD-10-CM | POA: Diagnosis not present

## 2020-03-10 DIAGNOSIS — Q782 Osteopetrosis: Secondary | ICD-10-CM

## 2020-03-10 DIAGNOSIS — M79609 Pain in unspecified limb: Secondary | ICD-10-CM | POA: Diagnosis not present

## 2020-03-10 DIAGNOSIS — R9389 Abnormal findings on diagnostic imaging of other specified body structures: Secondary | ICD-10-CM | POA: Insufficient documentation

## 2020-03-10 DIAGNOSIS — R0789 Other chest pain: Secondary | ICD-10-CM | POA: Insufficient documentation

## 2020-03-10 DIAGNOSIS — Y92410 Unspecified street and highway as the place of occurrence of the external cause: Secondary | ICD-10-CM | POA: Insufficient documentation

## 2020-03-10 DIAGNOSIS — M549 Dorsalgia, unspecified: Secondary | ICD-10-CM | POA: Insufficient documentation

## 2020-03-10 DIAGNOSIS — R1110 Vomiting, unspecified: Secondary | ICD-10-CM | POA: Insufficient documentation

## 2020-03-10 DIAGNOSIS — F17210 Nicotine dependence, cigarettes, uncomplicated: Secondary | ICD-10-CM | POA: Diagnosis not present

## 2020-03-10 DIAGNOSIS — M542 Cervicalgia: Secondary | ICD-10-CM | POA: Diagnosis not present

## 2020-03-10 DIAGNOSIS — R911 Solitary pulmonary nodule: Secondary | ICD-10-CM | POA: Insufficient documentation

## 2020-03-10 DIAGNOSIS — T1490XA Injury, unspecified, initial encounter: Secondary | ICD-10-CM

## 2020-03-10 DIAGNOSIS — S069X1A Unspecified intracranial injury with loss of consciousness of 30 minutes or less, initial encounter: Secondary | ICD-10-CM | POA: Insufficient documentation

## 2020-03-10 DIAGNOSIS — Z79899 Other long term (current) drug therapy: Secondary | ICD-10-CM | POA: Diagnosis not present

## 2020-03-10 HISTORY — DX: Abnormal findings on diagnostic imaging of other specified body structures: R93.89

## 2020-03-10 LAB — CBC WITH DIFFERENTIAL/PLATELET
Abs Immature Granulocytes: 0.06 10*3/uL (ref 0.00–0.07)
Basophils Absolute: 0 10*3/uL (ref 0.0–0.1)
Basophils Relative: 0 %
Eosinophils Absolute: 0 10*3/uL (ref 0.0–0.5)
Eosinophils Relative: 0 %
HCT: 44 % (ref 36.0–46.0)
Hemoglobin: 14.2 g/dL (ref 12.0–15.0)
Immature Granulocytes: 0 %
Lymphocytes Relative: 11 %
Lymphs Abs: 1.7 10*3/uL (ref 0.7–4.0)
MCH: 27 pg (ref 26.0–34.0)
MCHC: 32.3 g/dL (ref 30.0–36.0)
MCV: 83.7 fL (ref 80.0–100.0)
Monocytes Absolute: 0.7 10*3/uL (ref 0.1–1.0)
Monocytes Relative: 5 %
Neutro Abs: 12.6 10*3/uL — ABNORMAL HIGH (ref 1.7–7.7)
Neutrophils Relative %: 84 %
Platelets: 382 10*3/uL (ref 150–400)
RBC: 5.26 MIL/uL — ABNORMAL HIGH (ref 3.87–5.11)
RDW: 13.3 % (ref 11.5–15.5)
WBC: 15.2 10*3/uL — ABNORMAL HIGH (ref 4.0–10.5)
nRBC: 0 % (ref 0.0–0.2)

## 2020-03-10 LAB — COMPREHENSIVE METABOLIC PANEL
ALT: 26 U/L (ref 0–44)
AST: 24 U/L (ref 15–41)
Albumin: 4 g/dL (ref 3.5–5.0)
Alkaline Phosphatase: 96 U/L (ref 38–126)
Anion gap: 12 (ref 5–15)
BUN: 10 mg/dL (ref 6–20)
CO2: 22 mmol/L (ref 22–32)
Calcium: 9.6 mg/dL (ref 8.9–10.3)
Chloride: 104 mmol/L (ref 98–111)
Creatinine, Ser: 0.93 mg/dL (ref 0.44–1.00)
GFR calc Af Amer: >60 mL/min (ref >60)
GFR calc non Af Amer: >60 mL/min (ref >60)
Glucose, Bld: 108 mg/dL — ABNORMAL HIGH (ref 70–99)
Potassium: 3.9 mmol/L (ref 3.5–5.1)
Sodium: 138 mmol/L (ref 135–145)
Total Bilirubin: 0.6 mg/dL (ref 0.3–1.2)
Total Protein: 8.5 g/dL — ABNORMAL HIGH (ref 6.5–8.1)

## 2020-03-10 MED ORDER — ONDANSETRON HCL 4 MG/2ML IJ SOLN
4.0000 mg | Freq: Once | INTRAMUSCULAR | Status: AC
  Administered 2020-03-10: 4 mg via INTRAVENOUS
  Filled 2020-03-10: qty 2

## 2020-03-10 MED ORDER — IOHEXOL 300 MG/ML  SOLN
100.0000 mL | Freq: Once | INTRAMUSCULAR | Status: AC | PRN
  Administered 2020-03-10: 100 mL via INTRAVENOUS

## 2020-03-10 MED ORDER — MORPHINE SULFATE (PF) 4 MG/ML IV SOLN
4.0000 mg | Freq: Once | INTRAVENOUS | Status: AC
  Administered 2020-03-10: 4 mg via INTRAVENOUS
  Filled 2020-03-10: qty 1
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
## 2020-03-11 NOTE — Discharge Instructions (Addendum)
Your CT scan shows sclerosis in your thoracic and lumbar spine as well as your pelvis.  You need to have a medical work-up by your primary care doctor to see why this is occurring.  Your CT scan also shows a spot in your right lower lung.  You need a follow-up CT scan in 6-12 months that your primary doctor can arrange.

## 2020-03-11 NOTE — ED Provider Notes (Signed)
Care transferred to me.  Patient is diffusely sore but otherwise no acute findings on CT chest, abdomen, pelvis, and head/C-spine/maxillofacial.  Has an abrasion to her forehead but no laceration to repair.  She was made aware of the incidental findings found and appears stable for discharge.   Pricilla Loveless, MD 03/11/20 [phone]

## 2020-03-16 ENCOUNTER — Ambulatory Visit (INDEPENDENT_AMBULATORY_CARE_PROVIDER_SITE_OTHER): Payer: Medicaid Other | Admitting: Family Medicine

## 2020-03-16 ENCOUNTER — Encounter: Payer: Self-pay | Admitting: Family Medicine

## 2020-03-16 ENCOUNTER — Other Ambulatory Visit: Payer: Self-pay

## 2020-03-16 DIAGNOSIS — R911 Solitary pulmonary nodule: Secondary | ICD-10-CM | POA: Diagnosis not present

## 2020-03-16 DIAGNOSIS — R9389 Abnormal findings on diagnostic imaging of other specified body structures: Secondary | ICD-10-CM
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
## 2020-03-20 NOTE — Assessment & Plan Note (Signed)
Review of imaging from ED on 03/10/20, patient with normal lung exam today. Patient with tobacco history , reports smoking 2 cigarettes per day since 1994  - will order follow-up with CT at 6-12 months as recommended

## 2020-03-27 ENCOUNTER — Ambulatory Visit: Payer: Medicaid Other | Admitting: Family Medicine

## 2020-04-13 ENCOUNTER — Ambulatory Visit: Payer: Medicaid Other | Admitting: Family Medicine

## 2020-04-25 ENCOUNTER — Ambulatory Visit (INDEPENDENT_AMBULATORY_CARE_PROVIDER_SITE_OTHER): Payer: Medicaid Other | Admitting: Family Medicine

## 2020-04-25 ENCOUNTER — Other Ambulatory Visit: Payer: Self-pay

## 2020-04-25 ENCOUNTER — Other Ambulatory Visit (HOSPITAL_COMMUNITY)
Admission: RE | Admit: 2020-04-25 | Discharge: 2020-04-25 | Disposition: A | Discharge status: Home / Self Care | Payer: Medicaid Other | Source: Ambulatory Visit | Attending: Family Medicine | Admitting: Family Medicine

## 2020-04-25 VITALS — BP 124/60 | HR 79 | Wt 170.0 lb

## 2020-04-25 DIAGNOSIS — N898 Other specified noninflammatory disorders of vagina: Secondary | ICD-10-CM

## 2020-04-25 DIAGNOSIS — Z1211 Encounter for screening for malignant neoplasm of colon: Secondary | ICD-10-CM | POA: Diagnosis not present

## 2020-04-25 DIAGNOSIS — Q782 Osteopetrosis: Secondary | ICD-10-CM | POA: Diagnosis not present

## 2020-04-25 DIAGNOSIS — Z7251 High risk heterosexual behavior: Secondary | ICD-10-CM | POA: Diagnosis not present

## 2020-04-25 DIAGNOSIS — Z114 Encounter for screening for human immunodeficiency virus [HIV]: Secondary | ICD-10-CM | POA: Diagnosis not present

## 2020-04-25 DIAGNOSIS — E559 Vitamin D deficiency, unspecified: Secondary | ICD-10-CM

## 2020-04-25 DIAGNOSIS — R52 Pain, unspecified: Secondary | ICD-10-CM | POA: Insufficient documentation

## 2020-04-25 LAB — POCT WET PREP (WET MOUNT)
Clue Cells Wet Prep Whiff POC: NEGATIVE
Trichomonas Wet Prep HPF POC: ABSENT

## 2020-04-25 NOTE — Patient Instructions (Addendum)
It was great seeing you today!  Your lab work did not show evidence of vaginal infection at this time. Remember to use the condoms provided for you.   A referral was placed for your to see physical therapy to help you get stronger and hopefully have less pain. You should receive a phone call with more information.    Regarding lab work today:  Due to recent changes in healthcare laws, you may see the results of your imaging and laboratory studies on MyChart before your provider has had a chance to review them.  I understand that in some cases there may be results that are confusing or concerning to you. Not all laboratory results come back in the same time frame and you may be waiting for multiple results in order to interpret others.  Please give [place] 72 hours in order for your provider to thoroughly review all the results before contacting the office for clarification of your results. If everything is normal, you will get a letter in the mail or a message in My Chart. Please give [place] a call if you do not hear from [place] after 2 weeks.  Please bring all of your medications with you to each visit.    If you haven't already, sign up for My Chart to have easy access to your labs results, and communication with your primary care physician.  Feel free to call with any questions or concerns at any time, at [phone].   Take care,  Dr. Katherina [facility]

## 2020-04-25 NOTE — Assessment & Plan Note (Signed)
Wet prep negative. GC and chlamydia DNA  probe sent to lab. HIV and RPR collected. Advised patient to use barrier protection/condoms.   - F/U if symptoms not improving or getting worse.   - Self care instructions given including avoiding douching. Handout given.  - Return precautions including abdominal pain, fever, chills, nausea, or vomiting given.
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
## 2020-04-26 ENCOUNTER — Encounter: Payer: Self-pay | Admitting: Family Medicine

## 2020-04-26 LAB — CERVICOVAGINAL ANCILLARY ONLY
Chlamydia: NEGATIVE
Comment: NEGATIVE
Comment: NORMAL
Neisseria Gonorrhea: NEGATIVE

## 2020-04-26 LAB — VITAMIN D 25 HYDROXY (VIT D DEFICIENCY, FRACTURES): Vit D, 25-Hydroxy: 5.7 ng/mL — ABNORMAL LOW (ref 30.0–100.0)

## 2020-04-26 MED ORDER — VITAMIN D (ERGOCALCIFEROL) 1.25 MG (50000 UNIT) PO CAPS: 50000.0000 [IU] | ORAL_CAPSULE | ORAL | 0 refills | Status: DC

## 2020-04-26 NOTE — Addendum Note (Signed)
Addended by: Katha Cabal D on: 04/26/2020 07:17 PM   Modules accepted: Orders

## 2020-04-27 LAB — HIV ANTIBODY (ROUTINE TESTING W REFLEX): HIV Screen 4th Generation wRfx: NONREACTIVE

## 2020-04-27 LAB — PTH, INTACT AND CALCIUM
Calcium: 9.2 mg/dL (ref 8.7–10.2)
PTH: 57 pg/mL (ref 15–65)

## 2020-04-27 LAB — RPR: RPR Ser Ql: NONREACTIVE

## 2020-05-21 ENCOUNTER — Other Ambulatory Visit: Payer: Self-pay

## 2020-05-21 ENCOUNTER — Ambulatory Visit: Payer: Medicaid Other | Attending: Family Medicine

## 2020-05-21 DIAGNOSIS — M79609 Pain in unspecified limb: Secondary | ICD-10-CM

## 2020-05-21 DIAGNOSIS — M6281 Muscle weakness (generalized): Secondary | ICD-10-CM

## 2020-05-21 DIAGNOSIS — R262 Difficulty in walking, not elsewhere classified: Secondary | ICD-10-CM | POA: Diagnosis present
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
## 2020-06-13 ENCOUNTER — Other Ambulatory Visit: Payer: Self-pay

## 2020-06-13 ENCOUNTER — Ambulatory Visit: Payer: Medicaid Other | Attending: Family Medicine

## 2020-06-13 DIAGNOSIS — R262 Difficulty in walking, not elsewhere classified: Secondary | ICD-10-CM | POA: Diagnosis present

## 2020-06-13 DIAGNOSIS — M79609 Pain in unspecified limb: Secondary | ICD-10-CM | POA: Diagnosis present

## 2020-06-13 DIAGNOSIS — M6281 Muscle weakness (generalized): Secondary | ICD-10-CM | POA: Insufficient documentation

## 2020-06-13 NOTE — Therapy (Signed)
[facility] [address] [place], [place], [address] Phone: [phone]   Fax:  [phone]  Physical Therapy Treatment  Patient Details  Name: Ruth Gardner MRN: [id number] Date of Birth: 16-Aug-1960 Referring Provider (PT): Nestor Ramp, MD   Encounter Date: 06/13/2020   PT End of Session - 06/13/20 1043    Visit Number 2    Number of Visits 17    Date for PT Re-Evaluation 08/11/20    Authorization Type Medicaid    PT Start Time 1045    PT Stop Time 1125    PT Time Calculation (min) 40 min    Activity Tolerance Patient tolerated treatment well    Behavior During Therapy [facility] for tasks assessed/performed           Past Medical History:  Diagnosis Date  . Anxiety   . Bipolar 1 disorder (HCC)   . Depression   . Schizophrenia [facility])     Past Surgical History:  Procedure Laterality Date  . HERNIA REPAIR      There were no vitals filed for this visit.   Subjective Assessment - 06/13/20 1043    Subjective "I have been trying to do some of the stretches and it lets me sometimes, but sometimes it just doesn't. I've been trying to walk around the house without the rollator, but I keep it closeby. I have been able to go all the way down the hall and check the mailbox, but I go real slow."    Pertinent History Bipolar 1 disorder, anxiety, depression, schizophrenia    Limitations Sitting;Walking;Standing;House hold activities;Lifting    How long can you sit comfortably? "about 15-20 minutes at the most"    How long can you stand comfortably? "I have to wait for the bus and have to sit down on my rollator while I wait after a little while"    How long can you walk comfortably? "Not too long"    Diagnostic tests Plain films from 03/10/2020 of areas where pt was hurting and had signs of trauma were all negative for acute fracture (L tib/fib, L shoulder, R knee, R hand, R foot). R knee advanced degenerative changes of the lateral  compartment. R hand degenerative changes in IP joints.    Patient Stated Goals Go places and be able to do things without pain    Currently in Pain? Yes    Pain Score 7     Pain Location Generalized   Diffuse pain all over   Pain Descriptors / Indicators Sharp;Shooting    Pain Onset More than a month ago              [facility] PT Assessment - 06/13/20 0001      Assessment   Medical Diagnosis Pain aggravated by activities of daily living (R52)    Referring Provider (PT) Nestor Ramp, MD    Onset Date/Surgical Date 03/10/20      AROM   Right Shoulder Flexion 115 Degrees    Right Shoulder ABduction 110 Degrees    Right Shoulder External Rotation --   can place R hand behind head   Left Shoulder Flexion 70 Degrees    Left Shoulder ABduction 85 Degrees    Left Shoulder External Rotation --   unable secondary to pain                        [facility] Adult PT Treatment/Exercise - 06/13/20 0001  Self-Care   Self-Care Other Self-Care Comments    Other Self-Care Comments  Reviewed HEP and discussed consistency with stretches. Recommended that patient follow up with doctor for further assessment of L shoulder for potential rotator cuff involvement due to patient's significantly decreased mobility and strength with pain. Provided pt with tennis ball for self STM and myofascial release - demonstrated and pt returned demonstration of how to use.      Neck Exercises: Supine   Neck Retraction 20 reps      Shoulder Exercises: Supine   Flexion Limitations Supine chest press and FL with dowel AAROM x 10      Shoulder Exercises: Seated   Retraction Strengthening;Both;20 reps      Manual Therapy   Manual Therapy Soft tissue mobilization;Myofascial release;Passive ROM    Soft tissue mobilization Suboccipital release. STM and myofascial release along L upper trap and levator scap    Passive ROM Passive ROM to tolerance in all planes but limited due to pain and guarding      Neck  Exercises: Stretches   Other Neck Stretches Doorway stretch LUE x 1 min                  PT Education - 06/13/20 1332    Education Details Reviewed HEP and discussed consistency with stretches. Recommended that patient follow up with doctor for further assessment of L shoulder for potential rotator cuff involvement due to patient's significantly decreased mobility and strength with pain. Provided pt with tennis ball for self STM and myofascial release.    Person(s) Educated Patient    Methods Explanation;Demonstration;Tactile cues;Verbal cues    Comprehension Verbalized understanding;Returned demonstration;Verbal cues required;Tactile cues required;Need further instruction            PT Short Term Goals - 05/21/20 1431      PT SHORT TERM GOAL #1   Title Patient will be independent with initial HEP.    Baseline Pt provided initial HEP during evaluation 05/21/2020.    Time 4    Period Weeks    Status New    Target Date 07/09/20      PT SHORT TERM GOAL #2   Title Pt will be able to tolerate performing active cervical, BUE, BLE, and lumbar range of motion without significant increase in pain.    Baseline Pt was unable to perform AROM except shoulder AROM that was painful then held.    Time 4    Period Weeks    Status New    Target Date 07/09/20      PT SHORT TERM GOAL #3   Title Patient will demonstrate BUE and BLE gross MMT at least 3/5.    Baseline Pt with pain during AROM and limited mobility in BUE and BLE    Time 4    Period Weeks    Status New    Target Date 07/09/20      PT SHORT TERM GOAL #4   Title Patient will be able to wait 10 minutes for the bus without having to take a seat on rollator due to pain.    Baseline Pt currently sits on rollator seat as she waits for her bus.    Time 4    Period Weeks    Status New    Target Date 07/09/20             PT Long Term Goals - 05/21/20 1858      PT LONG TERM GOAL #1   Title  Patient will be independent  with advanced HEP.    Baseline Pt provided initial HEP during evaluation 05/21/2020.    Time 8    Period Weeks    Status New    Target Date 08/06/20      PT LONG TERM GOAL #2   Title Patient will demonstrate cervical, lumbar, BLE, and BUE AROM WFL with </= 4/10 pain.    Baseline Patient with 7/10 at rest and unable to tolerate AROM during evaluation    Time 8    Period Weeks    Status New    Target Date 08/06/20      PT LONG TERM GOAL #3   Title Patient will be able to tolerate sitting for at least 30 minutes without having to stand up due to significant pain.    Baseline Pt stood every 4-5 minutes during evaluation due to pain.    Time 8    Period Weeks    Status New    Target Date 08/06/20      PT LONG TERM GOAL #4   Title Patient will demonstrate gross BUE and BLE MMT at least 4/5.    Baseline Unable to assess due to patient with pain during AROM.    Time 8    Period Weeks    Status New    Target Date 08/06/20                 Plan - 06/13/20 1043    Clinical Impression Statement Patient had fair tolerance to treatment session with continued TTP and sensitivity along neck and LUE. Focus on neck and L shoulder today due to patient reporting increased pain in those regions compared to LLE today. She was able to tolerate performing L shoulder AROM today but has decreased mobility and demonstrates compensation with L upper trap activation and posterolateral trunk lean. Recommended that patient follow up with doctor for further assessment of L shoulder for potential rotator cuff involvement due to patient's significantly decreased mobility and strength with pain. She required frequent breaks during gentle STM and suboccipital release due to tenderness but reported it also provided relief. She explains that her niece and daughter assist her with grooming and other functional activities. She should benefit from continued skilled PT intervention to address deficits and allow for  improved ability perform functional tasks.    Personal Factors and Comorbidities Age;Fitness;Time since onset of injury/illness/exacerbation;Past/Current Experience;Comorbidity 3+    Comorbidities Bipolar 1 disorder, anxiety, depression, schizophrenia    Examination-Activity Limitations Bed Mobility;Bathing;Bend;Carry;Dressing;Stand;Stairs;Squat;Sleep;Sit;Locomotion Level;Reach Overhead;Hygiene/Grooming    Examination-Participation Restrictions Cleaning;Community Activity;Meal Prep;Laundry    Stability/Clinical Decision Making Unstable/Unpredictable    Rehab Potential Fair    PT Frequency 2x / week    PT Duration 8 weeks    PT Treatment/Interventions ADLs/Self Care Home Management;Aquatic Therapy;Cryotherapy;Electrical Stimulation;Iontophoresis 4mg /ml Dexamethasone;Moist Heat;Traction;Ultrasound;Neuromuscular re-education;Balance training;Therapeutic exercise;Therapeutic activities;Functional mobility training;Stair training;Gait training;Patient/family education;Taping;Manual techniques;Energy conservation;Dry needling;Passive range of motion    PT Next Visit Plan Desensitization and manual techniques as tolerated, PROM within pain free/tolerable range, light muscle activation and gentle stretches to tolerance.    PT Home Exercise Plan - upper trap stretch, levator scap stretch to tolerance, tennis ball for STM/myofascial release    Recommended Other Services Discussed returning to doctor for potential orthopedic referral    Consulted and Agree with Plan of Care Patient           Patient will benefit from skilled therapeutic intervention in order to improve the following deficits and impairments:  Difficulty walking,Decreased activity tolerance,Decreased balance,Decreased cognition,Decreased mobility,Decreased strength,Increased edema,Impaired sensation,Pain,Obesity,Impaired UE functional use,Increased muscle spasms,Decreased endurance,Decreased range of motion,Postural  dysfunction,Improper body mechanics  Visit Diagnosis: Pain in extremity, unspecified extremity  Muscle weakness (generalized)  Difficulty in walking, not elsewhere classified     Problem List Patient Active Problem List   Diagnosis Date Noted  . 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 Pain aggravated by activities of daily living 04/25/2020  . Nodule of lower lobe of right lung 03/10/2020  . Abnormal CT scan 03/10/2020  . Mixed incontinence 08/08/2019  . Bacterial vaginitis 03/28/2019  . Hematemesis 03/28/2019  . Vaginal discharge 12/13/2018  . Screening for colon cancer 12/13/2018  . Chronic post-traumatic stress disorder (PTSD) 05/29/2016  . Depression 05/29/2016  . Cognitive dysfunction 05/29/2016  . Screening for malignant neoplasm of cervix 11/27/2014  . Polyneuropathy 08/31/2014  . Snoring 04/25/2014  . GERD (gastroesophageal reflux disease) 04/25/2014  . Mood disorder ([place]) 03/27/2014  . Osteoarthritis of knees, bilateral 04/09/2012    Haydee Monica, PT, DPT 06/13/20 1:40 PM  [facility] [address] [place], [place], [address] Phone: [phone]   Fax:  [phone]  Name: Ruth Gardner MRN: [id number] Date of Birth: 1961/04/29

## 2020-06-18 ENCOUNTER — Ambulatory Visit: Payer: Medicaid Other

## 2020-06-18 ENCOUNTER — Other Ambulatory Visit: Payer: Self-pay

## 2020-06-18 DIAGNOSIS — M6281 Muscle weakness (generalized): Secondary | ICD-10-CM

## 2020-06-18 DIAGNOSIS — M79609 Pain in unspecified limb: Secondary | ICD-10-CM

## 2020-06-18 DIAGNOSIS — R262 Difficulty in walking, not elsewhere classified: Secondary | ICD-10-CM

## 2020-06-18 NOTE — Therapy (Signed)
[facility] [address] [place], [place], [address] Phone: [phone]   Fax:  [phone]  Physical Therapy Treatment  Patient Details  Name: Ruth Gardner MRN: [id number] Date of Birth: 01/05/1961 Referring Provider (PT): Nestor RampNeal, Sara L, MD   Encounter Date: 06/18/2020   PT End of Session - 06/18/20 1050    Visit Number 3    Number of Visits 17    Date for PT Re-Evaluation 08/11/20    Authorization Type Medicaid - submitted for re-auth 06/18/2020    PT Start Time 1045    PT Stop Time 1125    PT Time Calculation (min) 40 min    Activity Tolerance Patient tolerated treatment well    Behavior [facility] assessed/performed           Past Medical History:  Diagnosis Date  . Anxiety   . Bipolar 1 disorder (HCC)   . Depression   . Schizophrenia [facility](HCC)     Past Surgical History:  Procedure Laterality Date  . HERNIA REPAIR      There were no vitals filed for this visit.   Subjective Assessment - 06/18/20 1050    Subjective Pt reports trying to move her L shoulder around over the weekend. She states she stopped lying on her L shoulder and has been icing it, which has significantly decreased her pain. She states she took her medication and ate breakfast this morning.    Pertinent History Bipolar 1 disorder, anxiety, depression, schizophrenia    Limitations Sitting;Walking;Standing;House hold activities;Lifting    How long can you sit comfortably? "about 15-20 minutes at the most"    How long can you stand comfortably? "I have to wait for the bus and have to sit down on my rollator while I wait after a little while"    How long can you walk comfortably? "Not too long"    Diagnostic tests Plain films from 03/10/2020 of areas where pt was hurting and had signs of trauma were all negative for acute fracture (L tib/fib, L shoulder, R knee, R hand, R foot). R knee advanced degenerative changes of the lateral  compartment. R hand degenerative changes in IP joints.    Patient Stated Goals Go places and be able to do things without pain    Currently in Pain? Yes    Pain Score 7     Pain Location Leg    Pain Orientation Right;Left    Pain Descriptors / Indicators --   stiffness from standing in the cold waiting for the bus   Pain Radiating Towards B hips and groin. Denies L shoulder pain today    Pain Onset More than a month ago              [facility] - 06/18/20 0001      Assessment   Medical Diagnosis Pain aggravated by activities of daily living (R52)    Referring Provider (PT) Nestor RampNeal, Sara L, MD    Onset Date/Surgical Date 03/10/20      AROM   Overall AROM Comments Pain with L shoulder AROM. Pain and difficulty with attempted BLE AROM.    Right Shoulder Flexion 145 Degrees    Right Shoulder ABduction 156 Degrees    Right Shoulder Internal Rotation --   L4   Right Shoulder External Rotation --   hand to back of head   Left Shoulder Flexion 145 Degrees    Left Shoulder ABduction 135 Degrees  Left Shoulder Internal Rotation --   L5   Left Shoulder External Rotation --   hand to back of head     Strength   Overall Strength Comments NT due to pain with AROM/PROM                         [facility] Adult PT Treatment/Exercise - 06/18/20 0001      Self-Care   Self-Care Other Self-Care Comments    Other Self-Care Comments  HEP, continued use of tennis ball for STM and continued desensitization, staying active/increasing mobility and AROM of BLE as tolerated throughout the day. Strongly urged pt to request new rollator due to ineffective brakes on current rollator.      Exercises   Exercises Knee/Hip      Knee/Hip Exercises: Stretches   Passive Hamstring Stretch Right;Left;3 reps;20 seconds    Passive Hamstring Stretch Limitations seated at EOM      Knee/Hip Exercises: Aerobic   Nustep L2 x 5 min      Knee/Hip Exercises: Standing   Hip Flexion  AROM;Stengthening;Both;5 reps    Hip Flexion Limitations posterior trunk lean and limited due to pain    Other Standing Knee Exercises Alternating marches with BUE support at freemotion 5x each LE with posterolateral trunk lean and limitation due to pain      Knee/Hip Exercises: Seated   Sit to Sand 10 reps;with UE support      Shoulder Exercises: Pulleys   Scaption 3 minutes                  PT Education - 06/18/20 1259    Education Details HEP, continued use of tennis ball for STM and continued desensitization, staying active/increasing mobility and AROM of BLE as tolerated throughout the day. Strongly urged pt to request new rollator due to ineffective brakes on current rollator.    Person(s) Educated Patient    Methods Explanation;Demonstration;Tactile cues;Verbal cues    Comprehension Verbalized understanding;Returned demonstration;Need further instruction;Verbal cues required;Tactile cues required            PT Short Term Goals - 06/18/20 1301      PT SHORT TERM GOAL #1   Title Patient will be independent with initial HEP.    Baseline Pt provided initial HEP during evaluation 05/21/2020.    Time 4    Period Weeks    Status Achieved    Target Date 07/09/20      PT SHORT TERM GOAL #2   Title Pt will be able to tolerate performing active cervical, BUE, BLE, and lumbar range of motion without significant increase in pain.    Baseline Pt was unable to perform AROM except shoulder AROM that was painful then held.    Time 4    Period Weeks    Status On-going    Target Date 07/09/20      PT SHORT TERM GOAL #3   Title Patient will demonstrate BUE and BLE gross MMT at least 3/5.    Baseline Pt with pain during AROM and limited mobility in BUE and BLE    Time 4    Period Weeks    Status On-going    Target Date 07/09/20      PT SHORT TERM GOAL #4   Title Patient will be able to wait 10 minutes for the bus without having to take a seat on rollator due to pain.     Baseline Pt currently sits on rollator  seat as she waits for her bus.    Time 4    Period Weeks    Status On-going    Target Date 07/09/20             PT Long Term Goals - 05/21/20 1858      PT LONG TERM GOAL #1   Title Patient will be independent with advanced HEP.    Baseline Pt provided initial HEP during evaluation 05/21/2020.    Time 8    Period Weeks    Status New    Target Date 08/06/20      PT LONG TERM GOAL #2   Title Patient will demonstrate cervical, lumbar, BLE, and BUE AROM WFL with </= 4/10 pain.    Baseline Patient with 7/10 at rest and unable to tolerate AROM during evaluation    Time 8    Period Weeks    Status New    Target Date 08/06/20      PT LONG TERM GOAL #3   Title Patient will be able to tolerate sitting for at least 30 minutes without having to stand up due to significant pain.    Baseline Pt stood every 4-5 minutes during evaluation due to pain.    Time 8    Period Weeks    Status New    Target Date 08/06/20      PT LONG TERM GOAL #4   Title Patient will demonstrate gross BUE and BLE MMT at least 4/5.    Baseline Unable to assess due to patient with pain during AROM.    Time 8    Period Weeks    Status New    Target Date 08/06/20                 Plan - 06/18/20 1058    Clinical Impression Statement Patient had improved tolerance to treatment session today compared to previous sessions but continues to have limitations with BLE mobility and significant TTP/sensitivity along B hips and gluteal region. Her L shoulder AROM has improved significantly since previous session. She continues to have pain especially with shoulder ER and IR with assistance with grooming. She demonstrates contralateral trunk lean with alternating marches in standing (lean towards stance leg) and posterior trunk lean during hip FL with knee extended. She should benefit from continued skilled PT for general strengthening and improved tolerance with daily activities.     Personal Factors and Comorbidities Age;Fitness;Time since onset of injury/illness/exacerbation;Past/Current Experience;Comorbidity 3+    Comorbidities Bipolar 1 disorder, anxiety, depression, schizophrenia    Examination-Activity Limitations Bed Mobility;Bathing;Bend;Carry;Dressing;Stand;Stairs;Squat;Sleep;Sit;Locomotion Level;Reach Overhead;Hygiene/Grooming    Examination-Participation Restrictions Cleaning;Community Activity;Meal Prep;Laundry    Stability/Clinical Decision Making Unstable/Unpredictable    Rehab Potential Fair    PT Frequency 2x / week    PT Duration 8 weeks    PT Treatment/Interventions ADLs/Self Care Home Management;Aquatic Therapy;Cryotherapy;Electrical Stimulation;Iontophoresis 4mg /ml Dexamethasone;Moist Heat;Traction;Ultrasound;Neuromuscular re-education;Balance training;Therapeutic exercise;Therapeutic activities;Functional mobility training;Stair training;Gait training;Patient/family education;Taping;Manual techniques;Energy conservation;Dry needling;Passive range of motion    PT Next Visit Plan Desensitization and manual techniques as tolerated, PROM within pain free/tolerable range, light muscle activation and gentle stretches to tolerance. Light progression of BUE and BLE strengthening.    PT Home Exercise Plan - upper trap stretch, levator scap stretch to tolerance, tennis ball for STM/myofascial release    Consulted and Agree with Plan of Care Patient           Patient will benefit from skilled therapeutic intervention in order to improve the following deficits  and impairments:  Difficulty walking,Decreased activity tolerance,Decreased balance,Decreased cognition,Decreased mobility,Decreased strength,Increased edema,Impaired sensation,Pain,Obesity,Impaired UE functional use,Increased muscle spasms,Decreased endurance,Decreased range of motion,Postural dysfunction,Improper body mechanics  Visit Diagnosis: Pain in extremity, unspecified extremity  Muscle  weakness (generalized)  Difficulty in walking, not elsewhere classified     Problem List Patient Active Problem List   Diagnosis Date Noted  . 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 Pain aggravated by activities of daily living 04/25/2020  . Nodule of lower lobe of right lung 03/10/2020  . Abnormal CT scan 03/10/2020  . Mixed incontinence 08/08/2019  . Bacterial vaginitis 03/28/2019  . Hematemesis 03/28/2019  . Vaginal discharge 12/13/2018  . Screening for colon cancer 12/13/2018  . Chronic post-traumatic stress disorder (PTSD) 05/29/2016  . Depression 05/29/2016  . Cognitive dysfunction 05/29/2016  . Screening for malignant neoplasm of cervix 11/27/2014  . Polyneuropathy 08/31/2014  . Snoring 04/25/2014  . GERD (gastroesophageal reflux disease) 04/25/2014  . Mood disorder (HCC) 03/27/2014  . Osteoarthritis of knees, bilateral 04/09/2012    Check all possible CPT codes: 12458- Therapeutic Exercise, [phone]- Neuro Re-education, [phone] - Gait Training, [phone] - Manual Therapy, 97530 - Therapeutic Activities, [phone] - Self Care, [phone] - Mechanical traction, 97014 - Electrical stimulation (unattended), [id number] - Electrical stimulation (Manual), [id number] - Iontophoresis, [id number] - Ultrasound, [id number] - Physical performance training and [id number] - Aquatic therapy          Rhea Bleacher, PT, DPT 06/18/20 1:05 PM  [facility] [address] [place], [place], [address] Phone: [phone]   Fax:  [phone]  Name: Ronny Ruddell MRN: [id number] Date of Birth: 09-17-1960

## 2020-07-09 ENCOUNTER — Ambulatory Visit: Payer: Medicaid Other

## 2020-07-09 ENCOUNTER — Other Ambulatory Visit: Payer: Self-pay

## 2020-07-09 DIAGNOSIS — M6281 Muscle weakness (generalized): Secondary | ICD-10-CM

## 2020-07-09 DIAGNOSIS — M79609 Pain in unspecified limb: Secondary | ICD-10-CM | POA: Diagnosis not present

## 2020-07-09 DIAGNOSIS — R262 Difficulty in walking, not elsewhere classified: Secondary | ICD-10-CM

## 2020-07-09 NOTE — Therapy (Signed)
[facility] [address] [place], [place], [address] Phone: [phone]   Fax:  [phone]  Physical Therapy Treatment  Patient Details  Name: Ruth Gardner MRN: [id number] Date of Birth: 17-Nov-1960 Referring Provider (PT): Nestor Ramp, MD   Encounter Date: 07/09/2020   PT End of Session - 07/09/20 1044    Visit Number 4    Number of Visits 17    Date for PT Re-Evaluation 08/11/20    Authorization Type Medicaid    Authorization Time Period 07/09/2020 - 08/19/2020    Authorization - Visit Number 1    Authorization - Number of Visits 12    PT Start Time 1045    PT Stop Time 1130    PT Time Calculation (min) 45 min    Activity Tolerance Patient limited by pain    Behavior During Therapy [facility] for tasks assessed/performed           Past Medical History:  Diagnosis Date  . Anxiety   . Bipolar 1 disorder (HCC)   . Depression   . Schizophrenia [facility])     Past Surgical History:  Procedure Laterality Date  . HERNIA REPAIR      There were no vitals filed for this visit.   Subjective Assessment - 07/09/20 1045    Subjective Pt explains having increased soreness in R leg/knee and low back. She states she still has occasional sharp pain in L shoulder and frequent headaches, but her knee and back are her primary concerns today.    Pertinent History Bipolar 1 disorder, anxiety, depression, schizophrenia    Limitations Sitting;Walking;Standing;House hold activities;Lifting    How long can you sit comfortably? "about 15-20 minutes at the most"    How long can you stand comfortably? "I have to wait for the bus and have to sit down on my rollator while I wait after a little while"    How long can you walk comfortably? "Not too long"    Diagnostic tests Plain films from 03/10/2020 of areas where pt was hurting and had signs of trauma were all negative for acute fracture (L tib/fib, L shoulder, R knee, R hand, R foot). R knee  advanced degenerative changes of the lateral compartment. R hand degenerative changes in IP joints.    Patient Stated Goals Go places and be able to do things without pain    Currently in Pain? Yes    Pain Score 4     Pain Location Leg    Pain Orientation Right    Pain Descriptors / Indicators Sore   stiff and sore   Pain Onset More than a month ago              [facility] PT Assessment - 07/09/20 0001      Assessment   Medical Diagnosis Pain aggravated by activities of daily living (R52)    Referring Provider (PT) Nestor Ramp, MD    Onset Date/Surgical Date 03/10/20                         [facility] Adult PT Treatment/Exercise - 07/09/20 0001      Neck Exercises: Supine   Neck Retraction 20 reps      Knee/Hip Exercises: Stretches   Passive Hamstring Stretch Right;4 reps;30 seconds    Passive Hamstring Stretch Limitations seated at EOM with RLE on stool      Knee/Hip Exercises: Aerobic   Nustep L3 x 6 min LE  and UE      Knee/Hip Exercises: Supine   Short Arc Quad Sets Strengthening;Right;2 sets;10 reps    Straight Leg Raises Strengthening;Right;2 sets;10 reps    Straight Leg Raises Limitations small range; limited due to pain and weakness      Manual Therapy   Manual Therapy Soft tissue mobilization;Myofascial release    Soft tissue mobilization STM, IASTM, and myofascial release along R quads, TFL, ITB, and mid/distal hip ADD. Suboccipital release    Passive ROM R knee PROM and gentle stretching in all planes with limited tolerance due to pain                  PT Education - 07/09/20 1149    Education Details Updated HEP to include interventions for RLE. Adjusted rollator handles to be positioned in more appropriate height as pt was walking with stooped posture due to low handles. Demonstrated and had pt return demonstration of feeling for quad/VMO activation during exercises so she can tell if she is doing it.    Person(s) Educated Patient    Methods  Explanation;Demonstration;Tactile cues;Verbal cues    Comprehension Verbalized understanding;Returned demonstration;Verbal cues required;Tactile cues required;Need further instruction            PT Short Term Goals - 06/18/20 1301      PT SHORT TERM GOAL #1   Title Patient will be independent with initial HEP.    Baseline Pt provided initial HEP during evaluation 05/21/2020.    Time 4    Period Weeks    Status Achieved    Target Date 07/09/20      PT SHORT TERM GOAL #2   Title Pt will be able to tolerate performing active cervical, BUE, BLE, and lumbar range of motion without significant increase in pain.    Baseline Pt was unable to perform AROM except shoulder AROM that was painful then held.    Time 4    Period Weeks    Status On-going    Target Date 07/09/20      PT SHORT TERM GOAL #3   Title Patient will demonstrate BUE and BLE gross MMT at least 3/5.    Baseline Pt with pain during AROM and limited mobility in BUE and BLE    Time 4    Period Weeks    Status On-going    Target Date 07/09/20      PT SHORT TERM GOAL #4   Title Patient will be able to wait 10 minutes for the bus without having to take a seat on rollator due to pain.    Baseline Pt currently sits on rollator seat as she waits for her bus.    Time 4    Period Weeks    Status On-going    Target Date 07/09/20             PT Long Term Goals - 05/21/20 1858      PT LONG TERM GOAL #1   Title Patient will be independent with advanced HEP.    Baseline Pt provided initial HEP during evaluation 05/21/2020.    Time 8    Period Weeks    Status New    Target Date 08/06/20      PT LONG TERM GOAL #2   Title Patient will demonstrate cervical, lumbar, BLE, and BUE AROM WFL with </= 4/10 pain.    Baseline Patient with 7/10 at rest and unable to tolerate AROM during evaluation    Time 8  Period Weeks    Status New    Target Date 08/06/20      PT LONG TERM GOAL #3   Title Patient will be able to  tolerate sitting for at least 30 minutes without having to stand up due to significant pain.    Baseline Pt stood every 4-5 minutes during evaluation due to pain.    Time 8    Period Weeks    Status New    Target Date 08/06/20      PT LONG TERM GOAL #4   Title Patient will demonstrate gross BUE and BLE MMT at least 4/5.    Baseline Unable to assess due to patient with pain during AROM.    Time 8    Period Weeks    Status New    Target Date 08/06/20                 Plan - 07/09/20 1145    Clinical Impression Statement Patient had to cancel appointments over the past two weeks due to personal reasons. She states that she has not been able to perform HEP but returned to town this morning and plans to perform interventions regularly again as tolerated. She continues to have significant tenderness and sensitivity to palpation and STM along R thigh (lateral, anterior, medial, and posterior) with limited tolerance to gentle passive stretching secondary to pain. She also has tenderness during suboccipital release but expresses some relief afterwards. She demonstrates difficulty performing SAQ and SLR with RLE at this time but is able to perform within small range. She should benefit from continued skilled PT intervention to address deficits and allow for improved ability to perform functional tasks.    Personal Factors and Comorbidities Age;Fitness;Time since onset of injury/illness/exacerbation;Past/Current Experience;Comorbidity 3+    Comorbidities Bipolar 1 disorder, anxiety, depression, schizophrenia    Examination-Activity Limitations Bed Mobility;Bathing;Bend;Carry;Dressing;Stand;Stairs;Squat;Sleep;Sit;Locomotion Level;Reach Overhead;Hygiene/Grooming    Examination-Participation Restrictions Cleaning;Community Activity;Meal Prep;Laundry    Stability/Clinical Decision Making Unstable/Unpredictable    Rehab Potential Fair    PT Frequency 2x / week    PT Duration 8 weeks    PT  Treatment/Interventions ADLs/Self Care Home Management;Aquatic Therapy;Cryotherapy;Electrical Stimulation;Iontophoresis 4mg /ml Dexamethasone;Moist Heat;Traction;Ultrasound;Neuromuscular re-education;Balance training;Therapeutic exercise;Therapeutic activities;Functional mobility training;Stair training;Gait training;Patient/family education;Taping;Manual techniques;Energy conservation;Dry needling;Passive range of motion    PT Next Visit Plan Desensitization and manual techniques as tolerated, PROM within pain free/tolerable range, light muscle activation and gentle stretches to tolerance. Light progression of BUE and BLE strengthening.    PT Home Exercise Plan - upper trap stretch, levator scap stretch to tolerance, tennis ball for STM/myofascial release, SAQ, SLR small range, HS stretch, Thomas stretch    Consulted and Agree with Plan of Care Patient           Patient will benefit from skilled therapeutic intervention in order to improve the following deficits and impairments:  Difficulty walking,Decreased activity tolerance,Decreased balance,Decreased cognition,Decreased mobility,Decreased strength,Increased edema,Impaired sensation,Pain,Obesity,Impaired UE functional use,Increased muscle spasms,Decreased endurance,Decreased range of motion,Postural dysfunction,Improper body mechanics  Visit Diagnosis: Pain in extremity, unspecified extremity  Muscle weakness (generalized)  Difficulty in walking, not elsewhere classified     Problem List Patient Active Problem List   Diagnosis Date Noted  . 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 Pain aggravated by activities of daily living 04/25/2020  . Nodule of lower lobe of right lung 03/10/2020  . Abnormal CT scan 03/10/2020  . Mixed incontinence 08/08/2019  . Bacterial vaginitis 03/28/2019  . Hematemesis 03/28/2019  . Vaginal discharge 12/13/2018  . Screening for  colon cancer 12/13/2018  . Chronic post-traumatic stress disorder (PTSD)  05/29/2016  . Depression 05/29/2016  . Cognitive dysfunction 05/29/2016  . Screening for malignant neoplasm of cervix 11/27/2014  . Polyneuropathy 08/31/2014  . Snoring 04/25/2014  . GERD (gastroesophageal reflux disease) 04/25/2014  . Mood disorder (HCC) 03/27/2014  . Osteoarthritis of knees, bilateral 04/09/2012      Rhea Bleacher, PT, DPT 07/09/20 11:55 AM  [facility] [address] [place], [place], [address] Phone: [phone]   Fax:  [phone]  Name: Ruth Gardner MRN: [id number] Date of Birth: 1960/09/26

## 2020-07-16 ENCOUNTER — Other Ambulatory Visit: Payer: Self-pay

## 2020-07-16 ENCOUNTER — Ambulatory Visit: Payer: Medicaid Other | Attending: Family Medicine

## 2020-07-16 DIAGNOSIS — M79609 Pain in unspecified limb: Secondary | ICD-10-CM | POA: Diagnosis present

## 2020-07-16 DIAGNOSIS — R262 Difficulty in walking, not elsewhere classified: Secondary | ICD-10-CM | POA: Diagnosis present

## 2020-07-16 DIAGNOSIS — M6281 Muscle weakness (generalized): Secondary | ICD-10-CM

## 2020-07-16 NOTE — Therapy (Signed)
[place], [place], [address] Phone: [phone]   Fax:  [phone]  Physical Therapy Treatment  Patient Details  Name: Ruth Gardner MRN: [id number] Date of Birth: Oct 20, 1960 Referring Provider (PT): Dickie La, MD   Encounter Date: 07/16/2020   PT End of Session - 07/16/20 1053    Visit Number 5    Number of Visits 17    Date for PT Re-Evaluation 08/11/20    Authorization Type Medicaid    Authorization Time Period 07/09/2020 - 08/19/2020    Authorization - Visit Number 2    Authorization - Number of Visits 12    PT Start Time 2122    PT Stop Time 1128    PT Time Calculation (min) 43 min    Activity Tolerance Patient tolerated treatment well    Behavior During Therapy [facility] for tasks assessed/performed           Past Medical History:  Diagnosis Date  . Anxiety   . Bipolar 1 disorder ([place])   . Depression   . Schizophrenia [facility])     Past Surgical History:  Procedure Laterality Date  . HERNIA REPAIR      There were no vitals filed for this visit.   Subjective Assessment - 07/16/20 1050    Subjective "I have some soreness from the weather changing sometimes but doing better. I have been weaning myself off the rollator and haven't used it since the weekend."    Pertinent History Bipolar 1 disorder, anxiety, depression, schizophrenia    Limitations Sitting;Walking;Standing;House hold activities;Lifting    How long can you sit comfortably? "about 15-20 minutes at the most"    How long can you stand comfortably? "I have to wait for the bus and have to sit down on my rollator while I wait after a little while"    How long can you walk comfortably? "Not too long"    Diagnostic tests Plain films from 03/10/2020 of areas where pt was hurting and had signs of trauma were all negative for acute fracture (L tib/fib, L shoulder, R knee, R hand, R foot). R knee advanced degenerative changes of the lateral  compartment. R hand degenerative changes in IP joints.    Patient Stated Goals Go places and be able to do things without pain    Currently in Pain? No/denies    Pain Score 0-No pain    Pain Onset More than a month ago              [facility] PT Assessment - 07/16/20 0001      Assessment   Medical Diagnosis Pain aggravated by activities of daily living (R52)    Referring Provider (PT) Dickie La, MD    Onset Date/Surgical Date 03/10/20                         [facility] Adult PT Treatment/Exercise - 07/16/20 0001      Self-Care   Self-Care Other Self-Care Comments    Other Self-Care Comments  Updated HEP and discussed plan to progress standing activities and improve functional mobility over the next month to allow for increased independence when patient goes to [place]      Knee/Hip Exercises: Stretches   Passive Hamstring Stretch Right;Left;1 rep;60 seconds    Passive Hamstring Stretch Limitations seated at [employer]    Hip Flexor Stretch Limitations Thomas stretch x 60 sec each LE. Difficulty obtaining R knee FL when  stretching L hip FL    Other Knee/Hip Stretches Butterfly stretch 3 x 20 sec within tolerance (limited range)      Knee/Hip Exercises: Aerobic   Nustep L3 x 6 min UE and LE      Knee/Hip Exercises: Standing   Hip Flexion AROM;Stengthening;Both;15 reps;Knee bent    Hip Flexion Limitations Alternating marches on airex x 15 each LE with UE support at freemotion    Hip Extension Stengthening;Both;2 sets;10 reps;Knee straight    Extension Limitations Cues to avoid forward trunk lean    Forward Step Up Right;Left;2 sets;10 reps;Step Height: 4"    Forward Step Up Limitations 10x ascending with LLE and descending RLE then 10x opposite ascending/descending. Emphasis on slow, controlled motion    Other Standing Knee Exercises Farmer's carry x 180 feet with 5# in BUE for initial 90 feet then 5# in 1 UE for 40 feet and opposite UE for remaining 40 feet                   PT Education - 07/16/20 1404    Education Details Updated HEP and discussed plan to progress standing activities and improve functional mobility over the next month to allow for increased independence when patient goes to [employer]) Educated Patient    Methods Explanation;Demonstration;Tactile cues;Verbal cues    Comprehension Verbalized understanding;Returned demonstration;Tactile cues required;Verbal cues required            PT Short Term Goals - 07/16/20 1405      PT SHORT TERM GOAL #1   Title Patient will be independent with initial HEP.    Baseline Pt provided initial HEP during evaluation 05/21/2020.    Time 4    Period Weeks    Status Achieved    Target Date 07/09/20      PT SHORT TERM GOAL #2   Title Pt will be able to tolerate performing active cervical, BUE, BLE, and lumbar range of motion without significant increase in pain.    Baseline Pt was unable to perform AROM except shoulder AROM that was painful then held.    Time 4    Period Weeks    Status Partially Met    Target Date 07/09/20      PT SHORT TERM GOAL #3   Title Patient will demonstrate BUE and BLE gross MMT at least 3/5.    Baseline Continues to have difficulty performing R knee FL but shows improvement in other extremities    Time 4    Period Weeks    Status Partially Met    Target Date 07/09/20      PT SHORT TERM GOAL #4   Title Patient will be able to wait 10 minutes for the bus without having to take a seat on rollator due to pain.    Baseline Pt has better tolerance with standing but currently is receiving transportation [facility] rather than bus now    Time 4    Period Weeks    Status Partially Met    Target Date 07/09/20             PT Long Term Goals - 05/21/20 1858      PT LONG TERM GOAL #1   Title Patient will be independent with advanced HEP.    Baseline Pt provided initial HEP during evaluation 05/21/2020.    Time 8    Period Weeks    Status  New    Target Date 08/06/20  PT LONG TERM GOAL #2   Title Patient will demonstrate cervical, lumbar, BLE, and BUE AROM WFL with </= 4/10 pain.    Baseline Patient with 7/10 at rest and unable to tolerate AROM during evaluation    Time 8    Period Weeks    Status New    Target Date 08/06/20      PT LONG TERM GOAL #3   Title Patient will be able to tolerate sitting for at least 30 minutes without having to stand up due to significant pain.    Baseline Pt stood every 4-5 minutes during evaluation due to pain.    Time 8    Period Weeks    Status New    Target Date 08/06/20      PT LONG TERM GOAL #4   Title Patient will demonstrate gross BUE and BLE MMT at least 4/5.    Baseline Unable to assess due to patient with pain during AROM.    Time 8    Period Weeks    Status New    Target Date 08/06/20                 Plan - 07/16/20 1054    Clinical Impression Statement Patient presents to clinic today without rollator and is able to ambulate throughout session requiring brief standing rest breaks secondary to "tightnesss" and "stiffness" in BLE but no significant pain. She ambulates with wide stance and trendelenburg gait but is able to correct somewhat with cues for increased hip and knee flexion forward rather than stepping out diagonally to the side with each step. She expressed fatigue during interventions but was able to tolerate all exercises without adverse effects or significant pain. Pt has increased guarding and limited flexibility noted during BLE hamstring, hip FL, and hip ADD stretches. She should benefit from continued skilled PT intervention to improve functional mobility and flexibility. Pt plans to be in [place] from March until May potentially, and she is hoping to be able to perform daily activities independently by then as she currently requires assistance from family members. She reports her family members are present to help currently but will not be home with  her permanently.    Personal Factors and Comorbidities Age;Fitness;Time since onset of injury/illness/exacerbation;Past/Current Experience;Comorbidity 3+    Comorbidities Bipolar 1 disorder, anxiety, depression, schizophrenia    Examination-Activity Limitations Bed Mobility;Bathing;Bend;Carry;Dressing;Stand;Stairs;Squat;Sleep;Sit;Locomotion Level;Reach Overhead;Hygiene/Grooming    Examination-Participation Restrictions Cleaning;Community Activity;Meal Prep;Laundry    Stability/Clinical Decision Making Unstable/Unpredictable    Rehab Potential Fair    PT Frequency 2x / week    PT Duration 8 weeks    PT Treatment/Interventions ADLs/Self Care Home Management;Aquatic Therapy;Cryotherapy;Electrical Stimulation;Iontophoresis 45m/ml Dexamethasone;Moist Heat;Traction;Ultrasound;Neuromuscular re-education;Balance training;Therapeutic exercise;Therapeutic activities;Functional mobility training;Stair training;Gait training;Patient/family education;Taping;Manual techniques;Energy conservation;Dry needling;Passive range of motion    PT Next Visit Plan Continue progression of standing and resisted activities to work towards increasing independence with daily activities such as carrying groceries, pulling pants up, bathing, and supine<>sit transfers with increased ease. BLE stretches (HS, hip FL, ADD, TFL/IT band) within tolerance.    PT Home Exercise Plan [id number]- upper trap stretch, levator scap stretch to tolerance, tennis ball for STM/myofascial release, SAQ, SLR small range, HS stretch, Thomas stretch, butterlfy stretch    Consulted and Agree with Plan of Care Patient           Patient will benefit from skilled therapeutic intervention in order to improve the following deficits and impairments:  Difficulty walking,Decreased activity tolerance,Decreased balance,Decreased cognition,Decreased mobility,Decreased strength,Increased  edema,Impaired sensation,Pain,Obesity,Impaired UE functional use,Increased  muscle spasms,Decreased endurance,Decreased range of motion,Postural dysfunction,Improper body mechanics  Visit Diagnosis: Pain in extremity, unspecified extremity  Muscle weakness (generalized)  Difficulty in walking, not elsewhere classified     Problem List Patient Active Problem List   Diagnosis Date Noted  . 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 Pain aggravated by activities of daily living 04/25/2020  . Nodule of lower lobe of right lung 03/10/2020  . Abnormal CT scan 03/10/2020  . Mixed incontinence 08/08/2019  . Bacterial vaginitis 03/28/2019  . Hematemesis 03/28/2019  . Vaginal discharge 12/13/2018  . Screening for colon cancer 12/13/2018  . Chronic post-traumatic stress disorder (PTSD) 05/29/2016  . Depression 05/29/2016  . Cognitive dysfunction 05/29/2016  . Screening for malignant neoplasm of cervix 11/27/2014  . Polyneuropathy 08/31/2014  . Snoring 04/25/2014  . GERD (gastroesophageal reflux disease) 04/25/2014  . Mood disorder ([place]) 03/27/2014  . Osteoarthritis of knees, bilateral 04/09/2012     Haydee Monica, PT, DPT 07/16/20 2:09 PM  [facility] [address] [place], [place], [address] Phone: [phone]   Fax:  [phone]  Name: Ruth Gardner MRN: [id number] Date of Birth: 04-30-1961

## 2020-07-25 ENCOUNTER — Ambulatory Visit: Payer: Medicaid Other

## 2020-07-25 ENCOUNTER — Other Ambulatory Visit: Payer: Self-pay

## 2020-07-25 DIAGNOSIS — M79609 Pain in unspecified limb: Secondary | ICD-10-CM | POA: Diagnosis not present

## 2020-07-25 DIAGNOSIS — R262 Difficulty in walking, not elsewhere classified: Secondary | ICD-10-CM

## 2020-07-25 DIAGNOSIS — M6281 Muscle weakness (generalized): Secondary | ICD-10-CM

## 2020-07-25 NOTE — Therapy (Signed)
[place], [place], [address] Phone: [phone]   Fax:  [phone]  Physical Therapy Treatment  Patient Details  Name: Ruth Gardner MRN: [id number] Date of Birth: 12/02/1960 Referring Provider (PT): Dickie La, MD   Encounter Date: 07/25/2020   PT End of Session - 07/25/20 1101    Visit Number 6    Number of Visits 17    Date for PT Re-Evaluation 08/11/20    Authorization Type Medicaid    Authorization Time Period 07/09/2020 - 08/19/2020    Authorization - Visit Number 3    Authorization - Number of Visits 12    PT Start Time 1046    PT Stop Time 1130    PT Time Calculation (min) 44 min    Activity Tolerance Patient tolerated treatment well;Patient limited by pain    Behavior During Therapy [facility] for tasks assessed/performed           Past Medical History:  Diagnosis Date  . Anxiety   . Bipolar 1 disorder ([place])   . Depression   . Schizophrenia [facility])     Past Surgical History:  Procedure Laterality Date  . HERNIA REPAIR      There were no vitals filed for this visit.   Subjective Assessment - 07/25/20 1052    Subjective "It feels like it's up and down. On Valentine's day, I went to [employer] then walked around a trail for a couple hours." Pt reports taking multiple breaks but was able to walk around.    Pertinent History Bipolar 1 disorder, anxiety, depression, schizophrenia    Limitations Sitting;Walking;Standing;House hold activities;Lifting    How long can you sit comfortably? "about 15-20 minutes at the most"    How long can you stand comfortably? "I have to wait for the bus and have to sit down on my rollator while I wait after a little while"    How long can you walk comfortably? "Not too long"    Diagnostic tests Plain films from 03/10/2020 of areas where pt was hurting and had signs of trauma were all negative for acute fracture (L tib/fib, L shoulder, R knee, R hand, R foot). R  knee advanced degenerative changes of the lateral compartment. R hand degenerative changes in IP joints.    Patient Stated Goals Go places and be able to do things without pain    Currently in Pain? Yes    Pain Score 4     Pain Location --   diffuse pain all over along L side of body   Pain Orientation Left    Pain Descriptors / Indicators Aching;Sore;Tightness    Pain Radiating Towards stiff/sore RLE as well    Pain Onset More than a month ago              [facility] PT Assessment - 07/25/20 0001      Assessment   Medical Diagnosis Pain aggravated by activities of daily living (R52)    Referring Provider (PT) Dickie La, MD    Onset Date/Surgical Date 03/10/20      AROM   Right Shoulder Flexion 145 Degrees    Right Shoulder ABduction 112 Degrees    Left Shoulder Flexion 108 Degrees   with thoracic EXT   Left Shoulder ABduction 87 Degrees    Left Shoulder Internal Rotation --   to L glute med but crawls arm back   Left Shoulder External Rotation --   can place hand on head  at slow pace                        [facility] Adult PT Treatment/Exercise - 07/25/20 0001      Self-Care   Self-Care Other Self-Care Comments    Other Self-Care Comments  See patient education      Neck Exercises: Theraband   Rows 20 reps;Red      Knee/Hip Exercises: Aerobic   Nustep L4 x 5 min UE and LE (UE at level 9 length)      Knee/Hip Exercises: Standing   Forward Step Up Right;Left;2 sets;10 reps;Step Height: 4"    Forward Step Up Limitations 10x ascending with LLE and descending RLE then 10x opposite ascending/descending. Attempted 6" step but pt demonstrated heavy reliance on BUE to pull herself up rather than pushing through LE      Shoulder Exercises: Supine   Flexion AAROM;Both;20 reps    Flexion Limitations with dowel    ABduction AAROM;Left;20 reps    ABduction Limitations with dowel      Manual Therapy   Manual Therapy Soft tissue mobilization;Joint mobilization;Passive  ROM    Joint Mobilization GHJ grades I-IV (initially grades I-II) inferior and posterior mobilizations within tolerance    Soft tissue mobilization STM along L pec minor    Passive ROM L shoulder PROM in all planes then progressing into gentle stretches in each plane      Neck Exercises: Stretches   Corner Stretch 5 reps;10 seconds                  PT Education - 07/25/20 1136    Education Details Updated HEP for shoulder; discussed consistent compliance with HEP and moving shoulders/arms rather than avoiding movements to maintain mobility    Person(s) Educated Patient    Methods Explanation;Demonstration;Tactile cues;Verbal cues;Handout    Comprehension Verbalized understanding;Returned demonstration;Verbal cues required;Tactile cues required;Need further instruction            PT Short Term Goals - 07/16/20 1405      PT SHORT TERM GOAL #1   Title Patient will be independent with initial HEP.    Baseline Pt provided initial HEP during evaluation 05/21/2020.    Time 4    Period Weeks    Status Achieved    Target Date 07/09/20      PT SHORT TERM GOAL #2   Title Pt will be able to tolerate performing active cervical, BUE, BLE, and lumbar range of motion without significant increase in pain.    Baseline Pt was unable to perform AROM except shoulder AROM that was painful then held.    Time 4    Period Weeks    Status Partially Met    Target Date 07/09/20      PT SHORT TERM GOAL #3   Title Patient will demonstrate BUE and BLE gross MMT at least 3/5.    Baseline Continues to have difficulty performing R knee FL but shows improvement in other extremities    Time 4    Period Weeks    Status Partially Met    Target Date 07/09/20      PT SHORT TERM GOAL #4   Title Patient will be able to wait 10 minutes for the bus without having to take a seat on rollator due to pain.    Baseline Pt has better tolerance with standing but currently is receiving transportation via [facility] rather than bus now    Time  4    Period Weeks    Status Partially Met    Target Date 07/09/20             PT Long Term Goals - 05/21/20 1858      PT LONG TERM GOAL #1   Title Patient will be independent with advanced HEP.    Baseline Pt provided initial HEP during evaluation 05/21/2020.    Time 8    Period Weeks    Status New    Target Date 08/06/20      PT LONG TERM GOAL #2   Title Patient will demonstrate cervical, lumbar, BLE, and BUE AROM WFL with </= 4/10 pain.    Baseline Patient with 7/10 at rest and unable to tolerate AROM during evaluation    Time 8    Period Weeks    Status New    Target Date 08/06/20      PT LONG TERM GOAL #3   Title Patient will be able to tolerate sitting for at least 30 minutes without having to stand up due to significant pain.    Baseline Pt stood every 4-5 minutes during evaluation due to pain.    Time 8    Period Weeks    Status New    Target Date 08/06/20      PT LONG TERM GOAL #4   Title Patient will demonstrate gross BUE and BLE MMT at least 4/5.    Baseline Unable to assess due to patient with pain during AROM.    Time 8    Period Weeks    Status New    Target Date 08/06/20                 Plan - 07/25/20 1101    Clinical Impression Statement Patient presents with decreased bilateral shoulder mobility compared to previous measurement with muscle guarding and muscular tightness contributing to limitations. L shoulder PROM with firm muscular end feel and pt making apprehensive sounds but clarifies that it feels "tight" and "sore" but not necessarily painful. She explains that she has not been using or lifting her arms much unless she has to and has not consistently been compliant with HEP recently. She may continue to benefit from skilled PT to improve overall functional mobility but progress may continue to be gradual considering pt's fluctuating pain levels and tolerance.    Personal Factors and Comorbidities  Age;Fitness;Time since onset of injury/illness/exacerbation;Past/Current Experience;Comorbidity 3+    Comorbidities Bipolar 1 disorder, anxiety, depression, schizophrenia    Examination-Activity Limitations Bed Mobility;Bathing;Bend;Carry;Dressing;Stand;Stairs;Squat;Sleep;Sit;Locomotion Level;Reach Overhead;Hygiene/Grooming    Examination-Participation Restrictions Cleaning;Community Activity;Meal Prep;Laundry    Stability/Clinical Decision Making Unstable/Unpredictable    Rehab Potential Fair    PT Frequency 2x / week    PT Duration 8 weeks    PT Treatment/Interventions ADLs/Self Care Home Management;Aquatic Therapy;Cryotherapy;Electrical Stimulation;Iontophoresis 73m/ml Dexamethasone;Moist Heat;Traction;Ultrasound;Neuromuscular re-education;Balance training;Therapeutic exercise;Therapeutic activities;Functional mobility training;Stair training;Gait training;Patient/family education;Taping;Manual techniques;Energy conservation;Dry needling;Passive range of motion    PT Next Visit Plan Continue progression of standing and resisted activities to work towards increasing independence with daily activities such as carrying groceries, pulling pants up, bathing, and supine<>sit transfers with increased ease. BLE stretches (HS, hip FL, ADD, TFL/IT band) within tolerance. UE mobility and strength    PT Home Exercise Plan [id number]- upper trap stretch, levator scap stretch to tolerance, tennis ball for STM/myofascial release, SAQ, SLR small range, HS stretch, Thomas stretch, butterlfy stretch, shoulder FL/ABD AAROM with dowel, rows (red), corner stretch    Consulted and  Agree with Plan of Care Patient           Patient will benefit from skilled therapeutic intervention in order to improve the following deficits and impairments:  Difficulty walking,Decreased activity tolerance,Decreased balance,Decreased cognition,Decreased mobility,Decreased strength,Increased edema,Impaired sensation,Pain,Obesity,Impaired  UE functional use,Increased muscle spasms,Decreased endurance,Decreased range of motion,Postural dysfunction,Improper body mechanics  Visit Diagnosis: Pain in extremity, unspecified extremity  Muscle weakness (generalized)  Difficulty in walking, not elsewhere classified     Problem List Patient Active Problem List   Diagnosis Date Noted  . 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 Pain aggravated by activities of daily living 04/25/2020  . Nodule of lower lobe of right lung 03/10/2020  . Abnormal CT scan 03/10/2020  . Mixed incontinence 08/08/2019  . Bacterial vaginitis 03/28/2019  . Hematemesis 03/28/2019  . Vaginal discharge 12/13/2018  . Screening for colon cancer 12/13/2018  . Chronic post-traumatic stress disorder (PTSD) 05/29/2016  . Depression 05/29/2016  . Cognitive dysfunction 05/29/2016  . Screening for malignant neoplasm of cervix 11/27/2014  . Polyneuropathy 08/31/2014  . Snoring 04/25/2014  . GERD (gastroesophageal reflux disease) 04/25/2014  . Mood disorder ([place]) 03/27/2014  . Osteoarthritis of knees, bilateral 04/09/2012     Haydee Monica, PT, DPT 07/25/20 2:41 PM  [facility] [address] [place], [place], [address] Phone: [phone]   Fax:  [phone]  Name: Ruth Gardner MRN: [id number] Date of Birth: Mar 30, 1961

## 2020-07-26 ENCOUNTER — Other Ambulatory Visit: Payer: Self-pay | Admitting: Family Medicine

## 2020-07-26 DIAGNOSIS — Z1231 Encounter for screening mammogram for malignant neoplasm of breast: Secondary | ICD-10-CM

## 2020-08-01 ENCOUNTER — Other Ambulatory Visit: Payer: Self-pay

## 2020-08-01 ENCOUNTER — Ambulatory Visit: Payer: Medicaid Other

## 2020-08-01 DIAGNOSIS — M6281 Muscle weakness (generalized): Secondary | ICD-10-CM

## 2020-08-01 DIAGNOSIS — R262 Difficulty in walking, not elsewhere classified: Secondary | ICD-10-CM

## 2020-08-01 DIAGNOSIS — M79609 Pain in unspecified limb: Secondary | ICD-10-CM | POA: Diagnosis not present

## 2020-08-01 NOTE — Therapy (Signed)
[place], [place], [address] Phone: [phone]   Fax:  [phone]  Physical Therapy Treatment  Patient Details  Name: Ruth Gardner MRN: [id number] Date of Birth: [birth year] Referring Provider (PT): Dickie La, MD   Encounter Date: 08/01/2020   PT End of Session - 08/01/20 1054    Visit Number 7    Number of Visits 17    Date for PT Re-Evaluation 08/11/20    Authorization Type Medicaid    Authorization Time Period 07/09/2020 - 08/19/2020    Authorization - Visit Number 4    Authorization - Number of Visits 12    PT Start Time 1046    PT Stop Time 1126    PT Time Calculation (min) 40 min    Activity Tolerance Patient tolerated treatment well;Patient limited by pain    Behavior During Therapy [facility] for tasks assessed/performed           Past Medical History:  Diagnosis Date  . Anxiety   . Bipolar 1 disorder ([place])   . Depression   . Schizophrenia [facility])     Past Surgical History:  Procedure Laterality Date  . HERNIA REPAIR      There were no vitals filed for this visit.   Subjective Assessment - 08/01/20 1052    Subjective Pt reports she does not want to follow up with her doctor because she just wants to continue PT until she leaves for [place] in March. She states she has noticed improvement in her symptoms overall as she does exercises at home more frequently.    Pertinent History Bipolar 1 disorder, anxiety, depression, schizophrenia    Limitations Sitting;Walking;Standing;House hold activities;Lifting    How long can you sit comfortably? "about 15-20 minutes at the most"    How long can you stand comfortably? "I have to wait for the bus and have to sit down on my rollator while I wait after a little while"    How long can you walk comfortably? "Not too long"    Diagnostic tests Plain films from 03/10/2020 of areas where pt was hurting and had signs of trauma were all negative for  acute fracture (L tib/fib, L shoulder, R knee, R hand, R foot). R knee advanced degenerative changes of the lateral compartment. R hand degenerative changes in IP joints.    Patient Stated Goals Go places and be able to do things without pain    Currently in Pain? Yes    Pain Score 6     Pain Location --   diffuse pain all over along L side of body   Pain Orientation Left    Pain Descriptors / Indicators Aching;Sore;Tightness    Pain Type Acute pain    Pain Onset More than a month ago              [facility] PT Assessment - 08/01/20 0001      Assessment   Medical Diagnosis Pain aggravated by activities of daily living (R52)    Referring Provider (PT) Dickie La, MD    Onset Date/Surgical Date 03/10/20                         [facility] Adult PT Treatment/Exercise - 08/01/20 0001      Ambulation/Gait   Stairs Yes    Stairs Assistance 6: Modified independent (Device/Increase time)   handrails   Stair Management Technique Two rails;Step to pattern;Alternating pattern  alternating pattern ascending; step to pattern descending with RLE descent first   Number of Stairs 22    Height of Stairs --   3 x 4" and 1 x 6"   Gait Comments Increased time for demonstration and cues during stair negotiation. Seated rest breaks between 3 sets on 4" and 1 set on 6"      Self-Care   Self-Care Other Self-Care Comments    Other Self-Care Comments  See patient education      Lumbar Exercises: Supine   Pelvic Tilt 20 reps    Pelvic Tilt Limitations Heavy verbal, visual, and tactile cues with difficulty performing initially      Knee/Hip Exercises: Stretches   Hip Flexor Stretch Limitations Thomas stretch 3 x 60 sec each LE      Knee/Hip Exercises: Aerobic   Nustep L5 x 5 min UE and LE      Knee/Hip Exercises: Supine   Bridges 5 reps                  PT Education - 08/01/20 1351    Education Details Updated HEP to include posterior pelvic tilt    Person(s) Educated Patient     Methods Explanation;Demonstration;Tactile cues;Verbal cues;Handout    Comprehension Verbalized understanding;Returned demonstration;Verbal cues required;Tactile cues required;Need further instruction            PT Short Term Goals - 07/16/20 1405      PT SHORT TERM GOAL #1   Title Patient will be independent with initial HEP.    Baseline Pt provided initial HEP during evaluation 05/21/2020.    Time 4    Period Weeks    Status Achieved    Target Date 07/09/20      PT SHORT TERM GOAL #2   Title Pt will be able to tolerate performing active cervical, BUE, BLE, and lumbar range of motion without significant increase in pain.    Baseline Pt was unable to perform AROM except shoulder AROM that was painful then held.    Time 4    Period Weeks    Status Partially Met    Target Date 07/09/20      PT SHORT TERM GOAL #3   Title Patient will demonstrate BUE and BLE gross MMT at least 3/5.    Baseline Continues to have difficulty performing R knee FL but shows improvement in other extremities    Time 4    Period Weeks    Status Partially Met    Target Date 07/09/20      PT SHORT TERM GOAL #4   Title Patient will be able to wait 10 minutes for the bus without having to take a seat on rollator due to pain.    Baseline Pt has better tolerance with standing but currently is receiving transportation via [facility] rather than bus now    Time 4    Period Weeks    Status Partially Met    Target Date 07/09/20             PT Long Term Goals - 05/21/20 1858      PT LONG TERM GOAL #1   Title Patient will be independent with advanced HEP.    Baseline Pt provided initial HEP during evaluation 05/21/2020.    Time 8    Period Weeks    Status New    Target Date 08/06/20      PT LONG TERM GOAL #2   Title Patient will demonstrate cervical,  lumbar, BLE, and BUE AROM WFL with </= 4/10 pain.    Baseline Patient with 7/10 at rest and unable to tolerate AROM during evaluation    Time 8     Period Weeks    Status New    Target Date 08/06/20      PT LONG TERM GOAL #3   Title Patient will be able to tolerate sitting for at least 30 minutes without having to stand up due to significant pain.    Baseline Pt stood every 4-5 minutes during evaluation due to pain.    Time 8    Period Weeks    Status New    Target Date 08/06/20      PT LONG TERM GOAL #4   Title Patient will demonstrate gross BUE and BLE MMT at least 4/5.    Baseline Unable to assess due to patient with pain during AROM.    Time 8    Period Weeks    Status New    Target Date 08/06/20                 Plan - 08/01/20 1054    Clinical Impression Statement Patient had fair tolerance with treatment session overall. She demonstrates difficulty descending stairs while facing forward and tends to turn to her left side and hold onto the L handrail with BUE due to decreased bilteral eccentric quad control and knee pain. Attempted long sit test but patient with pain during attempt to extend LE bilaterally. L ASIS appears slightly inferior compared to R ASIS but patient unable to tolerate palpation or gentle mobilizations. She was unable to complete MET at this time but was able to perform L hip EXT isometric. Discussed follow up with physician as patient continues to have fairly consistent pain and hypersensitivity, but she declines and expresses that she would like to complete remaining 2 skilled PT visits before she goes to [place] for an undecided time period. She confirms she notes improvement in symptoms when she is more consistent with HEP, so she will plan to do exercises after D/C.    Personal Factors and Comorbidities Age;Fitness;Time since onset of injury/illness/exacerbation;Past/Current Experience;Comorbidity 3+    Comorbidities Bipolar 1 disorder, anxiety, depression, schizophrenia    Examination-Activity Limitations Bed Mobility;Bathing;Bend;Carry;Dressing;Stand;Stairs;Squat;Sleep;Sit;Locomotion  Level;Reach Overhead;Hygiene/Grooming    Examination-Participation Restrictions Cleaning;Community Activity;Meal Prep;Laundry    Stability/Clinical Decision Making Unstable/Unpredictable    Rehab Potential Fair    PT Frequency 2x / week    PT Duration 8 weeks    PT Treatment/Interventions ADLs/Self Care Home Management;Aquatic Therapy;Cryotherapy;Electrical Stimulation;Iontophoresis 38m/ml Dexamethasone;Moist Heat;Traction;Ultrasound;Neuromuscular re-education;Balance training;Therapeutic exercise;Therapeutic activities;Functional mobility training;Stair training;Gait training;Patient/family education;Taping;Manual techniques;Energy conservation;Dry needling;Passive range of motion    PT Next Visit Plan Continue progression of standing and resisted activities to work towards increasing independence with daily activities such as carrying groceries, pulling pants up, bathing, and supine<>sit transfers with increased ease. BLE stretches (HS, hip FL, ADD, TFL/IT band) within tolerance. UE mobility and strength, core strengthening    PT Home Exercise Plan [id number]- upper trap stretch, levator scap stretch to tolerance, tennis ball for STM/myofascial release, SAQ, SLR small range, HS stretch, Thomas stretch, butterlfy stretch, shoulder FL/ABD AAROM with dowel, rows (red), corner stretch, posterior pelvic tilt    Consulted and Agree with Plan of Care Patient           Patient will benefit from skilled therapeutic intervention in order to improve the following deficits and impairments:  Difficulty walking,Decreased activity tolerance,Decreased balance,Decreased cognition,Decreased mobility,Decreased strength,Increased edema,Impaired  sensation,Pain,Obesity,Impaired UE functional use,Increased muscle spasms,Decreased endurance,Decreased range of motion,Postural dysfunction,Improper body mechanics  Visit Diagnosis: Pain in extremity, unspecified extremity  Muscle weakness (generalized)  Difficulty in  walking, not elsewhere classified     Problem List Patient Active Problem List   Diagnosis Date Noted  . 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 Pain aggravated by activities of daily living 04/25/2020  . Nodule of lower lobe of right lung 03/10/2020  . Abnormal CT scan 03/10/2020  . Mixed incontinence 08/08/2019  . Bacterial vaginitis 03/28/2019  . Hematemesis 03/28/2019  . Vaginal discharge 12/13/2018  . Screening for colon cancer 12/13/2018  . Chronic post-traumatic stress disorder (PTSD) 05/29/2016  . Depression 05/29/2016  . Cognitive dysfunction 05/29/2016  . Screening for malignant neoplasm of cervix 11/27/2014  . Polyneuropathy 08/31/2014  . Snoring 04/25/2014  . GERD (gastroesophageal reflux disease) 04/25/2014  . Mood disorder ([place]) 03/27/2014  . Osteoarthritis of knees, bilateral 04/09/2012    Haydee Monica, PT, DPT 08/01/20 2:05 PM  [facility] [address] [place], [place], [address] Phone: [phone]   Fax:  [phone]  Name: Naviyah Schaffert MRN: [id number] Date of Birth: 1961/04/24

## 2020-08-06 ENCOUNTER — Other Ambulatory Visit: Payer: Self-pay

## 2020-08-06 ENCOUNTER — Ambulatory Visit: Payer: Medicaid Other

## 2020-08-06 DIAGNOSIS — M79609 Pain in unspecified limb: Secondary | ICD-10-CM | POA: Diagnosis not present

## 2020-08-06 DIAGNOSIS — R262 Difficulty in walking, not elsewhere classified: Secondary | ICD-10-CM

## 2020-08-06 DIAGNOSIS — M6281 Muscle weakness (generalized): Secondary | ICD-10-CM

## 2020-08-06 NOTE — Therapy (Signed)
[place], [place], [address] Phone: [phone]   Fax:  [phone]  Physical Therapy Treatment  Patient Details  Name: Ruth Gardner MRN: [id number] Date of Birth: [birth year] Referring Provider (PT): Dickie La, MD   Encounter Date: 08/06/2020   PT End of Session - 08/06/20 1043    Visit Number 8    Number of Visits 17    Date for PT Re-Evaluation 08/11/20    Authorization Type Medicaid    Authorization Time Period 07/09/2020 - 08/19/2020    Authorization - Visit Number 5    Authorization - Number of Visits 12    PT Start Time 6808    PT Stop Time 1123    PT Time Calculation (min) 38 min    Activity Tolerance Patient tolerated treatment well;Patient limited by pain    Behavior During Therapy [facility] for tasks assessed/performed           Past Medical History:  Diagnosis Date  . Anxiety   . Bipolar 1 disorder ([place])   . Depression   . Schizophrenia [facility])     Past Surgical History:  Procedure Laterality Date  . HERNIA REPAIR      There were no vitals filed for this visit.   Subjective Assessment - 08/06/20 1043    Subjective Pt reports soreness on L side of body upon arrival due to lying on left side for prolonged time period throughout the night. She states lying on left side feels better, but she is sore afterwards. She explains having increased soreness because of the cold and rainy weather over the weekend as well. Pt plans to leave for [place] after her appointment Monday (08/13/2020).    Pertinent History Bipolar 1 disorder, anxiety, depression, schizophrenia    Limitations Sitting;Walking;Standing;House hold activities;Lifting    How long can you sit comfortably? "about 15-20 minutes at the most"    How long can you stand comfortably? "I have to wait for the bus and have to sit down on my rollator while I wait after a little while"    How long can you walk comfortably? "Not too long"     Diagnostic tests Plain films from 03/10/2020 of areas where pt was hurting and had signs of trauma were all negative for acute fracture (L tib/fib, L shoulder, R knee, R hand, R foot). R knee advanced degenerative changes of the lateral compartment. R hand degenerative changes in IP joints.    Patient Stated Goals Go places and be able to do things without pain    Currently in Pain? Yes    Pain Score 6     Pain Location Generalized    Pain Orientation Left    Pain Descriptors / Indicators Sore;Aching;Tightness    Pain Onset More than a month ago              [facility] PT Assessment - 08/06/20 0001      Assessment   Medical Diagnosis Pain aggravated by activities of daily living (R52)    Referring Provider (PT) Dickie La, MD    Onset Date/Surgical Date 03/10/20                         [facility] Adult PT Treatment/Exercise - 08/06/20 0001      Self-Care   Self-Care Other Self-Care Comments    Other Self-Care Comments  See patient education      Neck Exercises: Supine  Other Supine Exercise unilateral shoulder FL against yellow theraband (holding with opposite UE) x 15 each UE    Other Supine Exercise bilateral isometric shoulder ABD (yellow band) in limited range - maintaining tension while performing bilateral shoulder FL x 20      Lumbar Exercises: Supine   Bent Knee Raise Limitations Alternating marches while maintaining low back against mat x 15 each LE      Knee/Hip Exercises: Aerobic   Nustep L5 x 5 min UE and LE      Knee/Hip Exercises: Standing   Forward Step Up Right;Left;Step Height: 6";15 reps    Forward Step Up Limitations UE support at freemotion but pt demonstrates improvement in ability to step up with decreased reliance on UE compared to previous attempt    Other Standing Knee Exercises Forward/backward steps over hurdle with BUE support at freemotion x 5. Side step over hurdle with 1 UE support at freemotion x 15 each direction      Knee/Hip  Exercises: Seated   Ball Squeeze Isometric ball squeeze and PPT with VC and TC x 20    Sit to Sand 2 sets;15 reps;without UE support   holding 5# KB                   PT Short Term Goals - 07/16/20 1405      PT SHORT TERM GOAL #1   Title Patient will be independent with initial HEP.    Baseline Pt provided initial HEP during evaluation 05/21/2020.    Time 4    Period Weeks    Status Achieved    Target Date 07/09/20      PT SHORT TERM GOAL #2   Title Pt will be able to tolerate performing active cervical, BUE, BLE, and lumbar range of motion without significant increase in pain.    Baseline Pt was unable to perform AROM except shoulder AROM that was painful then held.    Time 4    Period Weeks    Status Partially Met    Target Date 07/09/20      PT SHORT TERM GOAL #3   Title Patient will demonstrate BUE and BLE gross MMT at least 3/5.    Baseline Continues to have difficulty performing R knee FL but shows improvement in other extremities    Time 4    Period Weeks    Status Partially Met    Target Date 07/09/20      PT SHORT TERM GOAL #4   Title Patient will be able to wait 10 minutes for the bus without having to take a seat on rollator due to pain.    Baseline Pt has better tolerance with standing but currently is receiving transportation via [facility] rather than bus now    Time 4    Period Weeks    Status Partially Met    Target Date 07/09/20             PT Long Term Goals - 08/06/20 1341      PT LONG TERM GOAL #1   Title Patient will be independent with advanced HEP.    Baseline Pt provided initial HEP during evaluation 05/21/2020.    Time 8    Period Weeks    Status Achieved      PT LONG TERM GOAL #2   Title Patient will demonstrate cervical, lumbar, BLE, and BUE AROM WFL with </= 4/10 pain.    Baseline Improved mobility with soreness/stiffness but no  significant pain    Time 8    Period Weeks    Status Partially Met      PT LONG TERM GOAL  #3   Title Patient will be able to tolerate sitting for at least 30 minutes without having to stand up due to significant pain.    Baseline will assess next session    Time 8    Period Weeks    Status On-going      PT LONG TERM GOAL #4   Title Patient will demonstrate gross BUE and BLE MMT at least 4/5.    Baseline plan to assess next session    Time 8    Period Weeks    Status On-going                 Plan - 08/06/20 1044    Clinical Impression Statement Patient tolerated session well overall with focus on general functional strengthening to address tasks that patient has difficulty with, such as stepping in/out of tub, reaching overhead, and performing sit to stand transfers without rocking as compensatory movement. She was able to perform sit to stand holding 5# KB with good control this session. Will plan to reassess strength and mobility next session and D/C as patient will be leaving for [place] after Monday's PT appointment. Will plan to review final HEP and practice tasks that patient expresses continued difficulty with.    Personal Factors and Comorbidities Age;Fitness;Time since onset of injury/illness/exacerbation;Past/Current Experience;Comorbidity 3+    Comorbidities Bipolar 1 disorder, anxiety, depression, schizophrenia    Examination-Activity Limitations Bed Mobility;Bathing;Bend;Carry;Dressing;Stand;Stairs;Squat;Sleep;Sit;Locomotion Level;Reach Overhead;Hygiene/Grooming    Examination-Participation Restrictions Cleaning;Community Activity;Meal Prep;Laundry    Stability/Clinical Decision Making Unstable/Unpredictable    Rehab Potential Fair    PT Frequency 2x / week    PT Duration 8 weeks    PT Treatment/Interventions ADLs/Self Care Home Management;Aquatic Therapy;Cryotherapy;Electrical Stimulation;Iontophoresis 4mg /ml Dexamethasone;Moist Heat;Traction;Ultrasound;Neuromuscular re-education;Balance training;Therapeutic exercise;Therapeutic activities;Functional  mobility training;Stair training;Gait training;Patient/family education;Taping;Manual techniques;Energy conservation;Dry needling;Passive range of motion    PT Next Visit Plan Re-evaluate. Address remaining deficits and review final HEP prior to D/C next session.    PT Home Exercise Plan [id number] - upper trap stretch, levator scap stretch to tolerance, tennis ball for STM/myofascial release, SAQ, SLR small range, HS stretch, Thomas stretch, butterlfy stretch, shoulder FL/ABD AAROM with dowel, rows (red), corner stretch, posterior pelvic tilt    Consulted and Agree with Plan of Care Patient           Patient will benefit from skilled therapeutic intervention in order to improve the following deficits and impairments:  Difficulty walking,Decreased activity tolerance,Decreased balance,Decreased cognition,Decreased mobility,Decreased strength,Increased edema,Impaired sensation,Pain,Obesity,Impaired UE functional use,Increased muscle spasms,Decreased endurance,Decreased range of motion,Postural dysfunction,Improper body mechanics  Visit Diagnosis: Pain in extremity, unspecified extremity  Muscle weakness (generalized)  Difficulty in walking, not elsewhere classified     Problem List Patient Active Problem List   Diagnosis Date Noted  . 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 Pain aggravated by activities of daily living 04/25/2020  . Nodule of lower lobe of right lung 03/10/2020  . Abnormal CT scan 03/10/2020  . Mixed incontinence 08/08/2019  . Bacterial vaginitis 03/28/2019  . Hematemesis 03/28/2019  . Vaginal discharge 12/13/2018  . Screening for colon cancer 12/13/2018  . Chronic post-traumatic stress disorder (PTSD) 05/29/2016  . Depression 05/29/2016  . Cognitive dysfunction 05/29/2016  . Screening for malignant neoplasm of cervix 11/27/2014  . Polyneuropathy 08/31/2014  . Snoring 04/25/2014  . GERD (gastroesophageal reflux disease) 04/25/2014  . Mood disorder (  [place]) 03/27/2014  .  Osteoarthritis of knees, bilateral 04/09/2012      Haydee Monica, PT, DPT 08/06/20 1:43 PM  [facility] [address] [place], [place], [address] Phone: [phone]   Fax:  [phone]  Name: Lashika Erker MRN: [id number] Date of Birth: 10/28/1960

## 2020-08-13 ENCOUNTER — Other Ambulatory Visit: Payer: Self-pay

## 2020-08-13 ENCOUNTER — Ambulatory Visit: Payer: Medicaid Other | Attending: Family Medicine

## 2020-08-13 DIAGNOSIS — M6281 Muscle weakness (generalized): Secondary | ICD-10-CM | POA: Diagnosis present

## 2020-08-13 DIAGNOSIS — M79609 Pain in unspecified limb: Secondary | ICD-10-CM | POA: Diagnosis present

## 2020-08-13 DIAGNOSIS — R262 Difficulty in walking, not elsewhere classified: Secondary | ICD-10-CM | POA: Insufficient documentation

## 2020-08-13 NOTE — Therapy (Addendum)
[place], [place], [address] Phone: [phone]   Fax:  [phone]  Physical Therapy Treatment/Discharge Summary  Patient Details  Name: Ruth Gardner MRN: [id number] Date of Birth: 1960-09-12 Referring Provider (PT): Dickie La, MD   Encounter Date: 08/13/2020   PT End of Session - 08/13/20 1006    Visit Number 9    Number of Visits 17    Date for PT Re-Evaluation 08/11/20    Authorization Type Medicaid    Authorization Time Period 07/09/2020 - 08/19/2020    Authorization - Visit Number 6    Authorization - Number of Visits 12    PT Start Time 1000    PT Stop Time 1045    PT Time Calculation (min) 45 min    Activity Tolerance Patient tolerated treatment well    Behavior During Therapy [facility] for tasks assessed/performed           Past Medical History:  Diagnosis Date  . Anxiety   . Bipolar 1 disorder ([place])   . Depression   . Schizophrenia [facility])     Past Surgical History:  Procedure Laterality Date  . HERNIA REPAIR      There were no vitals filed for this visit.   Subjective Assessment - 08/13/20 1007    Pertinent History Bipolar 1 disorder, anxiety, depression, schizophrenia    Limitations Sitting;Walking;Standing;House hold activities;Lifting    How long can you sit comfortably? "about 15-20 minutes at the most"    How long can you stand comfortably? "I have to wait for the bus and have to sit down on my rollator while I wait after a little while"    How long can you walk comfortably? "Not too long"    Diagnostic tests Plain films from 03/10/2020 of areas where pt was hurting and had signs of trauma were all negative for acute fracture (L tib/fib, L shoulder, R knee, R hand, R foot). R knee advanced degenerative changes of the lateral compartment. R hand degenerative changes in IP joints.    Patient Stated Goals Go places and be able to do things without pain    Currently in Pain? Yes     Pain Score 4     Pain Location Knee    Pain Orientation Right    Pain Descriptors / Indicators Aching;Sore;Tightness    Pain Onset More than a month ago              [facility] PT Assessment - 08/13/20 0001      Assessment   Medical Diagnosis Pain aggravated by activities of daily living (R52)    Referring Provider (PT) Dickie La, MD    Onset Date/Surgical Date 03/10/20      AROM   Overall AROM Comments BUE and BLE AROM WFL without aggravation of pain except continued R knee pain with R knee FL. Limited bilateral shoulder ABD but no significant difference between LUE vs RUE      Strength   Overall Strength Comments BUE and BLE 4/5 - 4+/5 grossly with no pain during resisted motions except with resisted R knee extension                         [facility] Adult PT Treatment/Exercise - 08/13/20 0001      Self-Care   Self-Care Other Self-Care Comments    Other Self-Care Comments  See patient education      Knee/Hip Exercises: Standing  Functional Squat 10 reps    Functional Squat Limitations mini squats with UE support at freemotion    Other Standing Knee Exercises Forward/backward steps over hurdle with BUE support at freemotion x 10. Side step over hurdle with 1 UE support at freemotion x 15 each direction      Knee/Hip Exercises: Seated   Other Seated Knee/Hip Exercises hip hinge seated at EOM x 15    Sit to Sand 2 sets;15 reps;without UE support      Shoulder Exercises: Standing   External Rotation Strengthening;Left;15 reps;Theraband    Theraband Level (Shoulder External Rotation) Level 2 (Red)    Internal Rotation Strengthening;Left;15 reps;Theraband    Theraband Level (Shoulder Internal Rotation) Level 2 (Red)    Extension Strengthening;Both;20 reps;Theraband    Theraband Level (Shoulder Extension) Level 2 (Red)    Row Strengthening;Both;15 reps;Theraband    Theraband Level (Shoulder Row) Level 2 (Red)    Other Standing Exercises LUE wall wash x 20                   PT Education - 08/13/20 1330    Education Details Updated and reviewed final HEP and continued consistency with exercises for lasting benefit    Person(s) Educated Patient    Methods Explanation;Demonstration;Tactile cues;Verbal cues;Handout    Comprehension Verbalized understanding;Returned demonstration;Verbal cues required;Tactile cues required            PT Short Term Goals - 08/13/20 1042      PT SHORT TERM GOAL #1   Title Patient will be independent with initial HEP.    Baseline Pt provided initial HEP during evaluation 05/21/2020.    Time 4    Period Weeks    Status Achieved    Target Date 07/09/20      PT SHORT TERM GOAL #2   Title Pt will be able to tolerate performing active cervical, BUE, BLE, and lumbar range of motion without significant increase in pain.    Baseline Difficulty with lumbar AROM but reports "some pain and stiffness" with no significant pain    Time 4    Period Weeks    Status Achieved    Target Date 07/09/20      PT SHORT TERM GOAL #3   Title Patient will demonstrate BUE and BLE gross MMT at least 3/5.    Baseline See flowsheet    Time 4    Period Weeks    Status Achieved    Target Date 07/09/20      PT SHORT TERM GOAL #4   Title Patient will be able to wait 10 minutes for the bus without having to take a seat on rollator due to pain.    Baseline No longer takes bus lately but standing tolerance depends on severity of symptoms each day    Time 4    Period Weeks    Status Partially Met    Target Date 07/09/20             PT Long Term Goals - 08/13/20 1043      PT LONG TERM GOAL #1   Title Patient will be independent with advanced HEP.    Baseline Pt provided initial HEP during evaluation 05/21/2020.    Time 8    Period Weeks    Status Achieved      PT LONG TERM GOAL #2   Title Patient will demonstrate cervical, lumbar, BLE, and BUE AROM WFL with </= 4/10 pain.    Baseline Reports that  her pain remains  minimal compared to before    Time 8    Period Weeks    Status Achieved      PT LONG TERM GOAL #3   Title Patient will be able to tolerate sitting for at least 30 minutes without having to stand up due to significant pain.    Baseline 15 minutes    Time 8    Period Weeks    Status Partially Met      PT LONG TERM GOAL #4   Title Patient will demonstrate gross BUE and BLE MMT at least 4/5.    Baseline See flowsheet    Time 8    Period Weeks    Status Achieved                 Plan - 08/13/20 1008    Clinical Impression Statement Patient tolerated session well without complaints of increased pain aside from continued discomfort and pain in R knee. Reviewed general functional strengthening interventions and HEP for patient to continue following discharge. She presents with improved strength and decreased pain overall with good progress towards goals. She    Personal Factors and Comorbidities Age;Fitness;Time since onset of injury/illness/exacerbation;Past/Current Experience;Comorbidity 3+    Comorbidities Bipolar 1 disorder, anxiety, depression, schizophrenia    Examination-Activity Limitations Bed Mobility;Bathing;Bend;Carry;Dressing;Stand;Stairs;Squat;Sleep;Sit;Locomotion Level;Reach Overhead;Hygiene/Grooming    Examination-Participation Restrictions Cleaning;Community Activity;Meal Prep;Laundry    Stability/Clinical Decision Making Unstable/Unpredictable    Rehab Potential Fair    PT Frequency 2x / week    PT Duration 8 weeks    PT Treatment/Interventions ADLs/Self Care Home Management;Aquatic Therapy;Cryotherapy;Electrical Stimulation;Iontophoresis 84m/ml Dexamethasone;Moist Heat;Traction;Ultrasound;Neuromuscular re-education;Balance training;Therapeutic exercise;Therapeutic activities;Functional mobility training;Stair training;Gait training;Patient/family education;Taping;Manual techniques;Energy conservation;Dry needling;Passive range of motion    PT Next Visit Plan D/C this  session    PT Home Exercise Plan [id number]- upper trap stretch, levator scap stretch to tolerance, tennis ball for STM/myofascial release, SAQ, SLR small range, HS stretch, Thomas stretch, butterlfy stretch, shoulder FL/ABD AAROM with dowel, rows (red), corner stretch, posterior pelvic tilt    Consulted and Agree with Plan of Care Patient           Patient will benefit from skilled therapeutic intervention in order to improve the following deficits and impairments:  Difficulty walking,Decreased activity tolerance,Decreased balance,Decreased cognition,Decreased mobility,Decreased strength,Increased edema,Impaired sensation,Pain,Obesity,Impaired UE functional use,Increased muscle spasms,Decreased endurance,Decreased range of motion,Postural dysfunction,Improper body mechanics  Visit Diagnosis: Pain in extremity, unspecified extremity  Muscle weakness (generalized)  Difficulty in walking, not elsewhere classified     Problem List Patient Active Problem List   Diagnosis Date Noted  . 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 Pain aggravated by activities of daily living 04/25/2020  . Nodule of lower lobe of right lung 03/10/2020  . Abnormal CT scan 03/10/2020  . Mixed incontinence 08/08/2019  . Bacterial vaginitis 03/28/2019  . Hematemesis 03/28/2019  . Vaginal discharge 12/13/2018  . Screening for colon cancer 12/13/2018  . Chronic post-traumatic stress disorder (PTSD) 05/29/2016  . Depression 05/29/2016  . Cognitive dysfunction 05/29/2016  . Screening for malignant neoplasm of cervix 11/27/2014  . Polyneuropathy 08/31/2014  . Snoring 04/25/2014  . GERD (gastroesophageal reflux disease) 04/25/2014  . Mood disorder ([place] 03/27/2014  . Osteoarthritis of knees, bilateral 04/09/2012    PHYSICAL THERAPY DISCHARGE SUMMARY  Visits from Start of Care: 9  Current functional level related to goals / functional outcomes: See above   Remaining deficits: See above   Education /  Equipment: See above  Plan: Patient agrees to discharge.  Patient goals were partially met. Patient is being discharged due to being pleased with the current functional level.  ?????    Patient is pleased with her current level of function and plans to continue performing HEP independently while she is in [place] over the next few months.    Haydee Monica, PT, DPT 08/13/20 1:59 PM  [facility] [address] [place], [place], [address] Phone: [phone]   Fax:  [phone]  Name: Ruth Gardner MRN: [id number] Date of Birth: 20-Apr-1961

## 2020-08-14 ENCOUNTER — Telehealth: Payer: Self-pay | Admitting: *Deleted

## 2020-08-14 NOTE — Telephone Encounter (Signed)
Crystal from [facility] called and states that CT Chest f/u nodule from 03/2020 needs to be change to CT Chest w/o contrast.  Patient had a nodule found during a different scan and needs to have an initial chest CT ordered.  Will forward to MD to place this order.  Grae Leathers,CMA

## 2020-08-16 ENCOUNTER — Other Ambulatory Visit: Payer: Self-pay | Admitting: Family Medicine

## 2020-08-16 DIAGNOSIS — R911 Solitary pulmonary nodule: Secondary | ICD-10-CM

## 2020-08-16 NOTE — Progress Notes (Signed)
Ordered CT Chest to evaluate incidental 9 mm ground-glass airspace opacity in the right lower lobe per radiology recommendation.  Katha Cabal, DO PGY-2, [facility] 08/16/2020 10:48 AM

## 2020-08-28 ENCOUNTER — Ambulatory Visit
Admission: RE | Admit: 2020-08-28 | Discharge: 2020-08-28 | Disposition: A | Discharge status: Home / Self Care | Payer: Medicaid Other | Source: Ambulatory Visit | Attending: Family Medicine | Admitting: Family Medicine

## 2020-08-28 DIAGNOSIS — R911 Solitary pulmonary nodule: Secondary | ICD-10-CM

## 2020-08-28 IMAGING — CT CT CHEST W/O CM
2 of 4 series · 15 of 36 positions shown, 18 images · non-contrast
Comparison: [DATE]

CLINICAL DATA: Pulmonary nodule,

EXAM:
CT CHEST WITHOUT CONTRAST
TECHNIQUE: Multidetector CT imaging of the chest was performed following the
standard protocol without IV contrast.

[Series 2: chest 2.00 br40 s3 · axial · 0.73mm/px · z∈[+1538,+1774]mm · 12 of 140 slices shown, 15 images (1 of 2)]
[im 11/140  mediastinal]
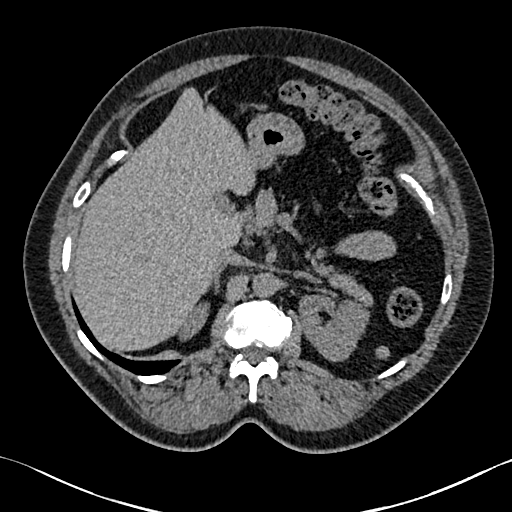
[im 11/140  lung]
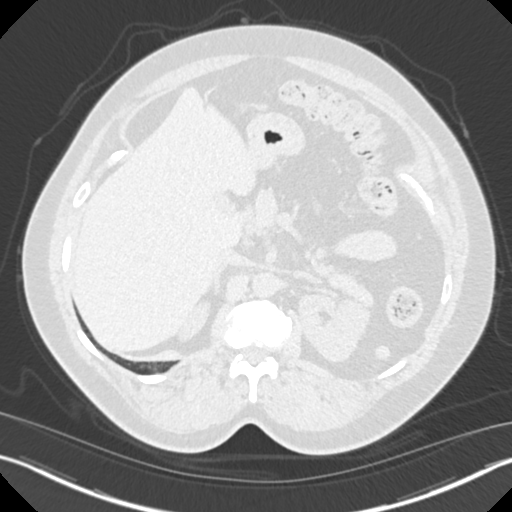
[im 22/140  lung]
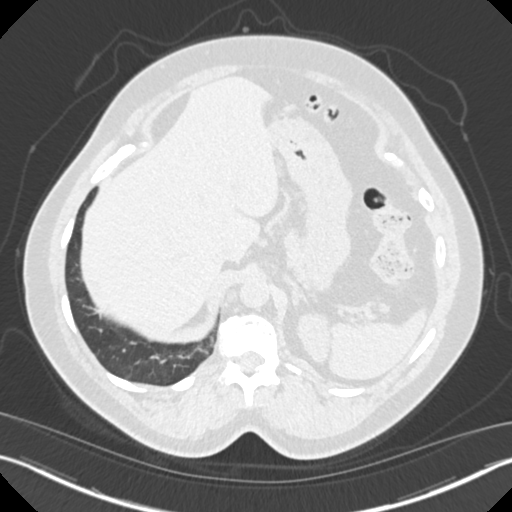
[im 33/140  lung]
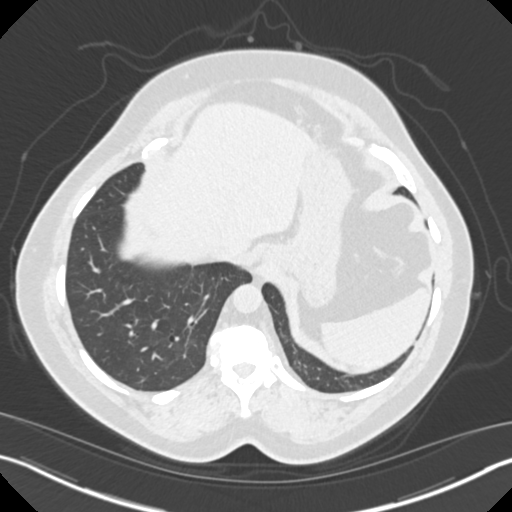
[im 43/140  lung]
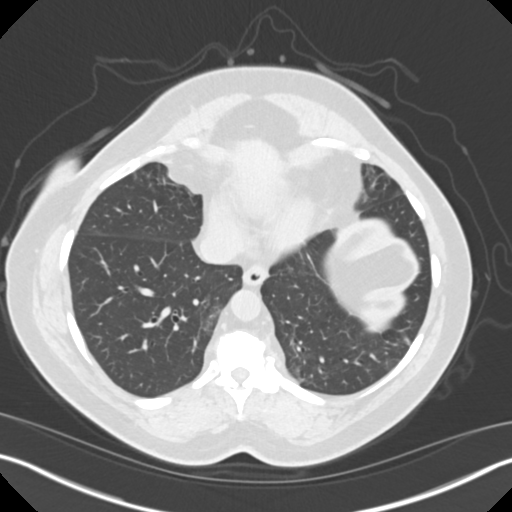
[im 54/140  mediastinal]
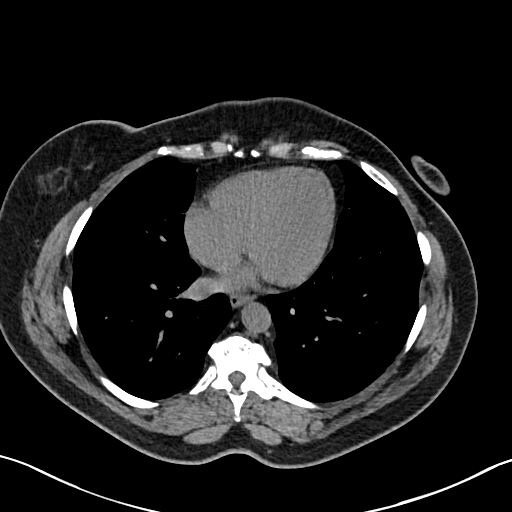
[im 54/140  lung]
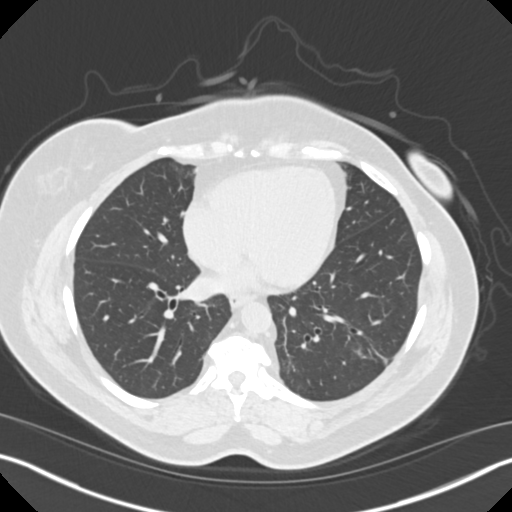
[im 65/140  lung]
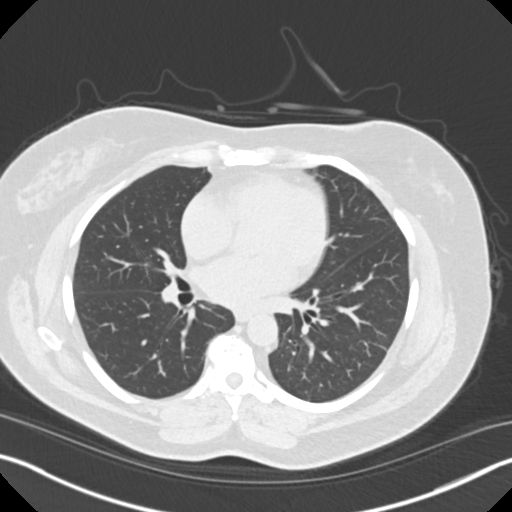
[im 75/140  lung]
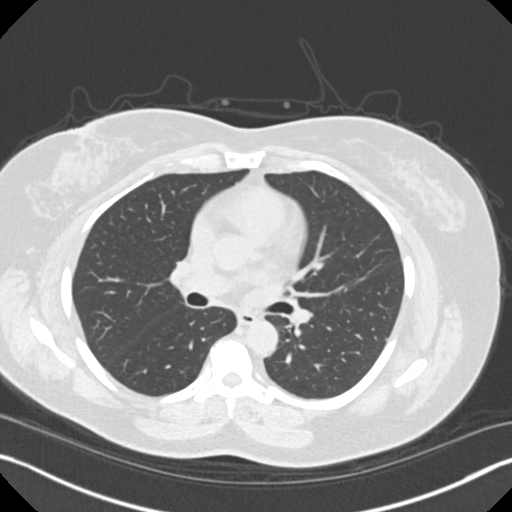
[im 86/140  lung]
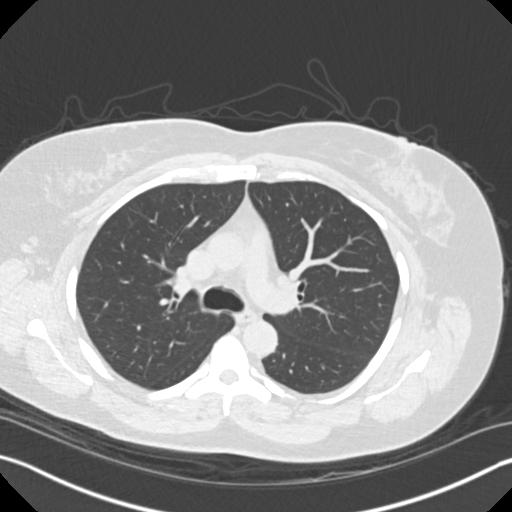
[im 97/140  mediastinal]
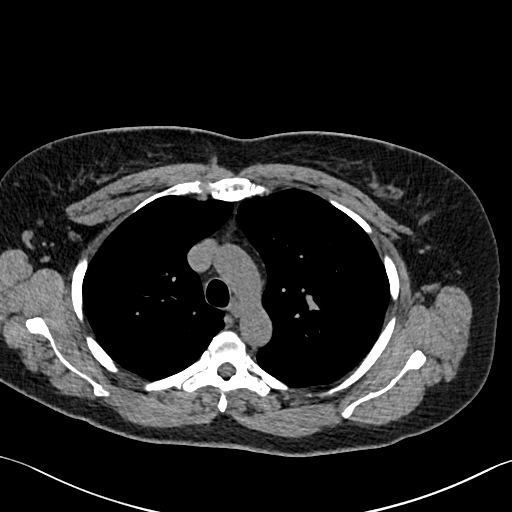
[im 97/140  lung]
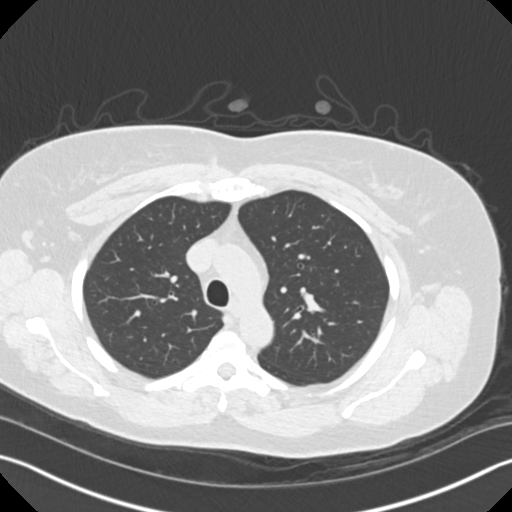
[im 107/140  lung]
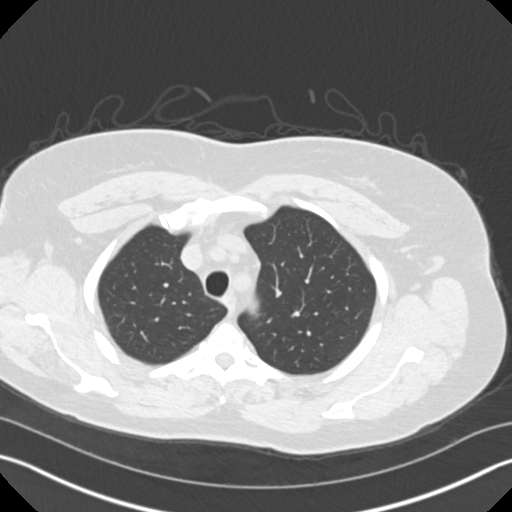
[im 118/140  lung]
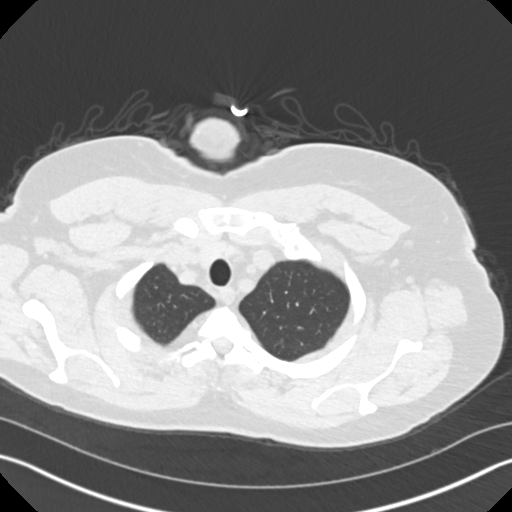
[im 129/140  lung]
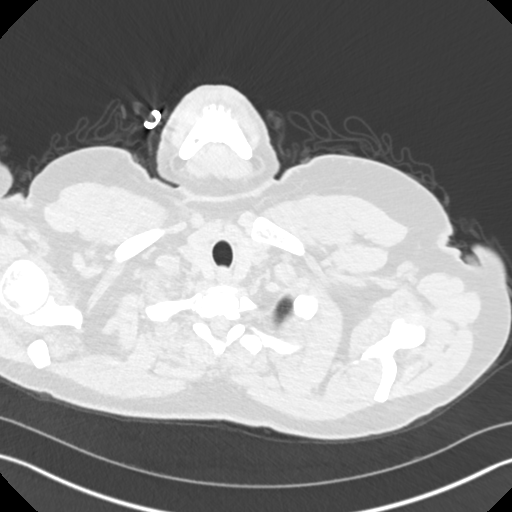

[Series 4: chest 2.00 br40 s3 · coronal · 0.55mm/px · 3 of 187 slices shown (2 of 2)]
[im 38/187  lung]
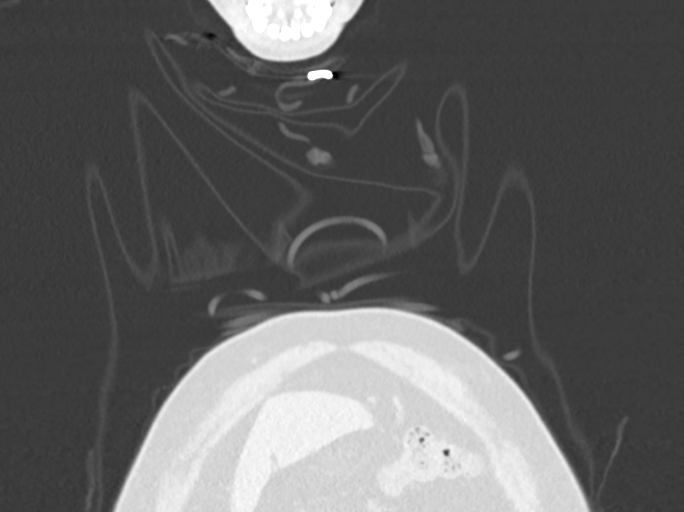
[im 75/187  lung]
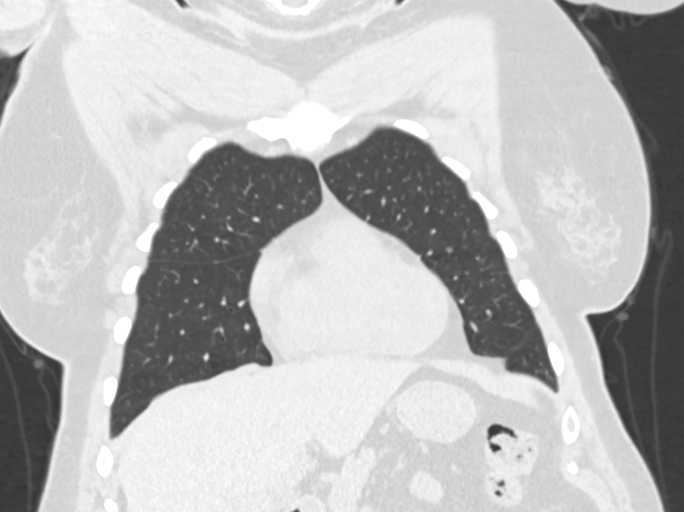
[im 112/187  lung]
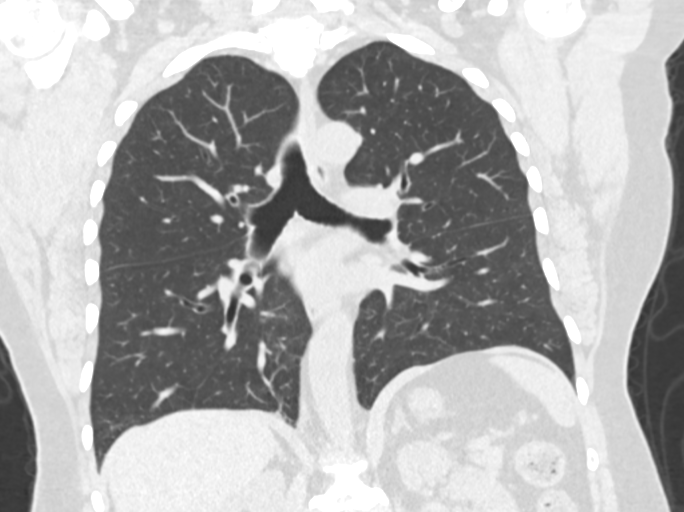

[15 of 36 positions shown; findings below may reference images not displayed]

FINDINGS: Cardiovascular: No significant coronary artery calcification. Global
cardiac size within normal limits. No pericardial effusion. The
central pulmonary arteries are of normal caliber. The thoracic aorta
is unremarkable.

Mediastinum/Nodes: Thyroid unremarkable. No pathologic thoracic
adenopathy. Esophagus unremarkable.

Lungs/Pleura: Mild bibasilar atelectasis. Previously noted a
ground-glass pulmonary nodule within the right lower lobe has
resolved. No suspicious focal pulmonary nodules or infiltrates are
identified. No pneumothorax or pleural effusion. The central airways
are widely patent.

Upper Abdomen: Unremarkable. Specifically, the spleen is of normal
size.

Musculoskeletal: Diffuse sclerosis of the osseous structures are
again identified. No acute bone abnormality. No superimposed
pathologic fracture. Degenerative changes are noted throughout the
thoracic spine.
IMPRESSION: Interval resolution of right lower lobe pulmonary nodule. No
residual suspicious focal pulmonary nodules or infiltrates.

Diffuse sclerosis of the visualized axial skeleton. Differential
considerations again include metastatic disease, leukemia,
hyperparathyroidism, sickle cell disease, and renal osteodystrophy,
though the normal appearing spleen makes leukemia and sickle cell
disease less likely.

## 2020-09-06 ENCOUNTER — Other Ambulatory Visit: Payer: Self-pay | Admitting: Family Medicine

## 2020-09-06 DIAGNOSIS — K219 Gastro-esophageal reflux disease without esophagitis: Secondary | ICD-10-CM

## 2020-09-14 ENCOUNTER — Ambulatory Visit
Admission: RE | Admit: 2020-09-14 | Discharge: 2020-09-14 | Disposition: A | Discharge status: Home / Self Care | Payer: Medicaid Other | Source: Ambulatory Visit | Attending: Family Medicine | Admitting: Family Medicine

## 2020-09-14 ENCOUNTER — Other Ambulatory Visit: Payer: Self-pay

## 2020-09-14 DIAGNOSIS — Z1231 Encounter for screening mammogram for malignant neoplasm of breast: Secondary | ICD-10-CM

## 2021-01-28 NOTE — Progress Notes (Signed)
Patient no showed to appointment.  Shirlean Mylar, MD [facility], PGY-3

## 2021-01-29 ENCOUNTER — Ambulatory Visit (INDEPENDENT_AMBULATORY_CARE_PROVIDER_SITE_OTHER): Payer: Medicaid Other | Admitting: Family Medicine

## 2021-01-29 DIAGNOSIS — N898 Other specified noninflammatory disorders of vagina: Secondary | ICD-10-CM

## 2021-01-29 DIAGNOSIS — Z113 Encounter for screening for infections with a predominantly sexual mode of transmission: Secondary | ICD-10-CM

## 2021-01-29 DIAGNOSIS — Z114 Encounter for screening for human immunodeficiency virus [HIV]: Secondary | ICD-10-CM

## 2021-01-29 DIAGNOSIS — R911 Solitary pulmonary nodule: Secondary | ICD-10-CM

## 2021-02-06 ENCOUNTER — Ambulatory Visit: Payer: Medicaid Other | Admitting: Family Medicine

## 2021-02-14 ENCOUNTER — Ambulatory Visit: Payer: Medicaid Other

## 2021-04-11 ENCOUNTER — Ambulatory Visit: Payer: Medicaid Other

## 2021-05-07 ENCOUNTER — Telehealth: Payer: Self-pay | Admitting: *Deleted

## 2021-05-07 DIAGNOSIS — H269 Unspecified cataract: Secondary | ICD-10-CM

## 2021-05-07 NOTE — Telephone Encounter (Signed)
Patient called and states that she was seen at [facility] at [facility] and they saw a cataract in one of her eyes.  She is asking for a referral to an ophthalmologist.  I called [facility] and asked them to fax over the office note for review.  Will forward to MD to place referral once they have been received.  Ruth Gardner,CMA

## 2021-05-08 ENCOUNTER — Other Ambulatory Visit: Payer: Self-pay | Admitting: Family Medicine

## 2021-05-31 ENCOUNTER — Other Ambulatory Visit: Payer: Self-pay

## 2021-06-11 ENCOUNTER — Other Ambulatory Visit: Payer: Self-pay | Admitting: Family Medicine

## 2021-06-11 DIAGNOSIS — K219 Gastro-esophageal reflux disease without esophagitis: Secondary | ICD-10-CM

## 2021-08-06 ENCOUNTER — Other Ambulatory Visit: Payer: Self-pay | Admitting: Family Medicine

## 2021-08-06 DIAGNOSIS — Z1231 Encounter for screening mammogram for malignant neoplasm of breast: Secondary | ICD-10-CM

## 2021-08-29 ENCOUNTER — Encounter: Payer: Medicaid Other | Admitting: Family Medicine

## 2021-09-17 ENCOUNTER — Ambulatory Visit: Payer: Medicaid Other

## 2021-09-23 ENCOUNTER — Encounter: Payer: Medicaid Other | Admitting: Family Medicine

## 2021-10-08 ENCOUNTER — Other Ambulatory Visit: Payer: Self-pay | Admitting: Family Medicine

## 2021-10-08 DIAGNOSIS — Z1231 Encounter for screening mammogram for malignant neoplasm of breast: Secondary | ICD-10-CM

## 2021-10-15 ENCOUNTER — Ambulatory Visit
Admission: RE | Admit: 2021-10-15 | Discharge: 2021-10-15 | Disposition: A | Discharge status: Home / Self Care | Payer: Medicaid Other | Source: Ambulatory Visit | Attending: Family Medicine | Admitting: Family Medicine

## 2021-10-15 ENCOUNTER — Ambulatory Visit (INDEPENDENT_AMBULATORY_CARE_PROVIDER_SITE_OTHER): Payer: Medicaid Other | Admitting: Family Medicine

## 2021-10-15 DIAGNOSIS — Z5321 Procedure and treatment not carried out due to patient leaving prior to being seen by health care provider: Secondary | ICD-10-CM

## 2021-10-15 DIAGNOSIS — Z1231 Encounter for screening mammogram for malignant neoplasm of breast: Secondary | ICD-10-CM

## 2021-10-15 NOTE — Progress Notes (Signed)
? ?  Ruth Gardner is a 61 y.o. female here for physical however she left prior to being seen ? ? ? ? ? ? ?

## 2021-11-25 ENCOUNTER — Encounter: Payer: Medicaid Other | Admitting: Family Medicine

## 2022-01-08 ENCOUNTER — Encounter: Payer: Medicaid Other | Admitting: Family Medicine

## 2022-01-20 ENCOUNTER — Encounter: Payer: Medicaid Other | Admitting: Family Medicine

## 2022-03-11 ENCOUNTER — Encounter: Payer: Self-pay | Admitting: Family Medicine

## 2022-03-11 ENCOUNTER — Other Ambulatory Visit (HOSPITAL_COMMUNITY)
Admission: RE | Admit: 2022-03-11 | Discharge: 2022-03-11 | Disposition: A | Discharge status: Home / Self Care | Payer: Medicaid Other | Source: Ambulatory Visit | Attending: Family Medicine | Admitting: Family Medicine

## 2022-03-11 ENCOUNTER — Ambulatory Visit (INDEPENDENT_AMBULATORY_CARE_PROVIDER_SITE_OTHER): Payer: Medicaid Other | Admitting: Family Medicine

## 2022-03-11 VITALS — BP 120/67 | HR 74 | Ht 60.0 in | Wt 175.0 lb

## 2022-03-11 DIAGNOSIS — Z01419 Encounter for gynecological examination (general) (routine) without abnormal findings: Secondary | ICD-10-CM | POA: Diagnosis not present

## 2022-03-11 DIAGNOSIS — Z124 Encounter for screening for malignant neoplasm of cervix: Secondary | ICD-10-CM

## 2022-03-11 DIAGNOSIS — M170 Bilateral primary osteoarthritis of knee: Secondary | ICD-10-CM | POA: Diagnosis not present

## 2022-03-11 DIAGNOSIS — Z113 Encounter for screening for infections with a predominantly sexual mode of transmission: Secondary | ICD-10-CM

## 2022-03-11 DIAGNOSIS — R52 Pain, unspecified: Secondary | ICD-10-CM | POA: Diagnosis not present

## 2022-03-11 DIAGNOSIS — Z1211 Encounter for screening for malignant neoplasm of colon: Secondary | ICD-10-CM | POA: Diagnosis not present

## 2022-03-11 LAB — POCT WET PREP (WET MOUNT)
Clue Cells Wet Prep Whiff POC: POSITIVE
Trichomonas Wet Prep HPF POC: ABSENT
WBC, Wet Prep HPF POC: NONE SEEN

## 2022-03-11 MED ORDER — METRONIDAZOLE 500 MG PO TABS: 500.0000 mg | ORAL_TABLET | Freq: Two times a day (BID) | ORAL | 0 refills | Status: DC

## 2022-03-11 NOTE — Progress Notes (Signed)
    SUBJECTIVE:   Chief compliant/HPI: annual examination  Ruth Gardner is a 61 y.o. who presents today for an annual exam.   Review of systems form notable for accidental incontinence where patient is unable to make it to the restroom in time due to arthritic knees. She has previously had to use incontinence pull-ups, but is wondering if she can get the bed chucks instead.  Had recent intercourse in which the condom malfunctioned and would like to be STD tested.   Updated history tabs and problem list.   OBJECTIVE:   BP 120/67   Pulse 74   Ht 5' (1.524 m)   Wt 175 lb (79.4 kg)   LMP 10/14/2014 (Approximate)   BMI 34.18 kg/m   Gen: well-appearing, NAD CV: RRR, no m/r/g appreciated, no peripheral edema Pulm: CTAB, no wheezes/crackles GI: soft, non-tender, non-distended Pelvic exam: normal external genitalia, vulva, vagina, cervix, uterus and adnexa, PAP: Pap smear done today, WET MOUNT done - results: clue cells, excessive bacteria, exam chaperoned by Salvatore Marvel.  ASSESSMENT/PLAN:   Bilateral osteoarthritis Patient with abnormal gait and significant pain (more in the right leg than left) secondary to bilateral knee osteoarthritis.  Previously was going to do surgery with orthopedics, but was unable to do so due to family issues at that time.  Would like to pursue this option again. - Referral to orthopedics placed  Colon cancer screening - Referral to GI placed  STD screening Wet prep positive for BV - RVP and HIV requested - Metronidazole $RemoveBeforeD'500mg'MBsonUlGaWabce$  BID x7 days prescribed - gonorrhea and chlamydia testing collected with pap smear  Annual Examination  See AVS for age appropriate recommendations.   Blood pressure reviewed and at goal .  Asked about intimate partner violence and patient reports none.  Considered the following items based upon USPSTF recommendations: HIV testing: ordered GC/CT at high risk, ordered.  Lipid panel (nonfasting or fasting) discussed  based upon AHA recommendations and not ordered.  Consider repeat every 4-6 years.  Reviewed risk factors for latent tuberculosis and not indicated  Discussed family history, BRCA testing not indicated. Tool used to risk stratify was Pedigree Assessment tool   Cervical cancer screening: due for Pap today, cytology + HPV ordered  Follow up in 1   year or sooner if indicated.    [name], [name]

## 2022-03-11 NOTE — Patient Instructions (Signed)
We have any Pap smear or STD testing today, some of these test will take a couple of days to come back.  I will let you know if there are any issues.  I have given you some diet education things regarding trying to lower your cholesterol to keep your heart healthy.  I placed a referral to the GI doctor for colonoscopy and to orthopedic doctor to try and get you the knee replacements

## 2022-03-12 LAB — HIV ANTIBODY (ROUTINE TESTING W REFLEX): HIV Screen 4th Generation wRfx: NONREACTIVE

## 2022-03-12 LAB — T PALLIDUM ANTIBODY, EIA: T pallidum Antibody, EIA: POSITIVE — AB

## 2022-03-12 LAB — RPR W/REFLEX TO TREPSURE: RPR: NONREACTIVE

## 2022-03-13 LAB — CYTOLOGY - PAP
Chlamydia: NEGATIVE
Comment: NEGATIVE
Comment: NEGATIVE
Comment: NEGATIVE
Comment: NORMAL
Diagnosis: NEGATIVE
High risk HPV: NEGATIVE
Neisseria Gonorrhea: NEGATIVE
Trichomonas: NEGATIVE

## 2022-03-14 ENCOUNTER — Telehealth: Payer: Self-pay | Admitting: Family Medicine

## 2022-03-14 NOTE — Telephone Encounter (Signed)
Called patient with results. Wrong reflex testing was ordered for the RPR, given the negative testing on RPR, will not further pursue the antibody testing. Pap smear normal and will need to be redone in 5 years.   Moise Friday, DO

## 2022-03-26 ENCOUNTER — Ambulatory Visit: Payer: Medicaid Other | Admitting: Orthopaedic Surgery

## 2022-10-01 ENCOUNTER — Ambulatory Visit: Payer: Medicaid Other

## 2023-01-28 ENCOUNTER — Other Ambulatory Visit: Payer: Self-pay | Admitting: Family Medicine

## 2023-01-28 ENCOUNTER — Other Ambulatory Visit (HOSPITAL_COMMUNITY)
Admission: RE | Admit: 2023-01-28 | Discharge: 2023-01-28 | Disposition: A | Discharge status: Home / Self Care | Payer: MEDICAID | Source: Ambulatory Visit | Attending: Family Medicine | Admitting: Family Medicine

## 2023-01-28 ENCOUNTER — Encounter: Payer: Self-pay | Admitting: Student

## 2023-01-28 ENCOUNTER — Ambulatory Visit (INDEPENDENT_AMBULATORY_CARE_PROVIDER_SITE_OTHER): Payer: MEDICAID | Admitting: Student

## 2023-01-28 DIAGNOSIS — Z113 Encounter for screening for infections with a predominantly sexual mode of transmission: Secondary | ICD-10-CM

## 2023-01-28 DIAGNOSIS — N898 Other specified noninflammatory disorders of vagina: Secondary | ICD-10-CM

## 2023-01-28 DIAGNOSIS — Z1231 Encounter for screening mammogram for malignant neoplasm of breast: Secondary | ICD-10-CM

## 2023-01-28 DIAGNOSIS — B9689 Other specified bacterial agents as the cause of diseases classified elsewhere: Secondary | ICD-10-CM | POA: Diagnosis not present

## 2023-01-28 DIAGNOSIS — N760 Acute vaginitis: Secondary | ICD-10-CM | POA: Diagnosis not present

## 2023-01-28 LAB — POCT WET PREP (WET MOUNT)
Clue Cells Wet Prep Whiff POC: POSITIVE
Trichomonas Wet Prep HPF POC: ABSENT

## 2023-01-28 LAB — POCT URINALYSIS DIP (MANUAL ENTRY)
Bilirubin, UA: NEGATIVE
Glucose, UA: NEGATIVE mg/dL
Ketones, POC UA: NEGATIVE mg/dL
Leukocytes, UA: NEGATIVE
Nitrite, UA: NEGATIVE
Protein Ur, POC: 100 mg/dL — AB
Spec Grav, UA: >=1.03 — AB (ref 1.010–1.025)
Urobilinogen, UA: 0.2 E.U./dL
pH, UA: 5 (ref 5.0–8.0)

## 2023-01-28 LAB — POCT UA - MICROSCOPIC ONLY: WBC, Ur, HPF, POC: NONE SEEN (ref 0–5)

## 2023-01-28 NOTE — Patient Instructions (Addendum)
It was great seeing you today.  As we discussed, -Continue using condoms each time you have sexual intercourse to protect against sexually transmitted infections. -We collected testing today.  If anything is abnormal or requires treatment, I will call you.  Otherwise, I will send you a letter in the mail. -Should you develop a painful boil/abscess under your armpit, groin or on your bottom, please come and see [place].  You may have something called hidradenitis suppurativa. -If you prefer a female provider, just let them know at the front desk and they will help you.   If you have any questions or concerns, please feel free to call the clinic.   Have a wonderful day,  Dr. Darral Dash [facility] [phone]

## 2023-01-28 NOTE — Progress Notes (Signed)
    SUBJECTIVE:   CHIEF COMPLAINT / HPI:   Ruth Gardner   Reports thick, white vaginal discharge and odor for 2 weeks. She is currently sexually active with 2 men. Uses condoms for barrier methods, reports consistency with using them with sexual intercourse. Asked about if she needs testing for herpes- denies any symptoms of an outbreak (including vesicles, pain, tingling)  Wants to know how often she should get tested for HIV  PERTINENT  PMH / PSH: Cognitive dysfunction, cPTSD,   OBJECTIVE:   LMP 10/14/2014 (Approximate)  General: Well-groomed, pleasant and well-appearing Respiratory: Normal work of breathing on room air GU: Gilberto Better, CMA present as chaperone for exam.External vulva and vagina nonerythematous, without any obvious lesions or rash. Yellowish tinted discharge appreciated.  Normal ruggae of vaginal walls.  Cervix is non erythematous and non-friable.    ASSESSMENT/PLAN:   Bacterial vaginosis POC wet prep positive for moderate clue cells, bacteria Given that she is symptomatic, plan to treat with metronidazole 500 mg twice daily for 7 days Tried to call patient to discuss results with her, but was unable to get in touch with her.  Will continue to try to relay results.   I discussed screening for HIV is typically done at an annual visit, unless she is at high risk.  Collected HIV, RPR and GC/chlamydia STI screening today.  Encouraged her to continue using barrier methods with intercourse.  Darral Dash, DO [facility]

## 2023-01-29 LAB — HIV ANTIBODY (ROUTINE TESTING W REFLEX): HIV Screen 4th Generation wRfx: NONREACTIVE

## 2023-01-29 LAB — RPR: RPR Ser Ql: NONREACTIVE

## 2023-01-30 LAB — CERVICOVAGINAL ANCILLARY ONLY
Chlamydia: NEGATIVE
Comment: NEGATIVE
Comment: NEGATIVE
Comment: NORMAL
Neisseria Gonorrhea: NEGATIVE
Trichomonas: NEGATIVE

## 2023-02-01 DIAGNOSIS — B9689 Other specified bacterial agents as the cause of diseases classified elsewhere: Secondary | ICD-10-CM | POA: Insufficient documentation

## 2023-02-01 MED ORDER — METRONIDAZOLE 500 MG PO TABS: 500.0000 mg | ORAL_TABLET | Freq: Two times a day (BID) | ORAL | 0 refills | Status: AC

## 2023-02-01 NOTE — Assessment & Plan Note (Signed)
POC wet prep positive for moderate clue cells, bacteria Given that she is symptomatic, plan to treat with metronidazole 500 mg twice daily for 7 days Tried to call patient to discuss results with her, but was unable to get in touch with her.  Will continue to try to relay results.

## 2023-02-02 ENCOUNTER — Other Ambulatory Visit: Payer: Self-pay | Admitting: Student

## 2023-02-02 ENCOUNTER — Ambulatory Visit: Payer: MEDICAID

## 2023-02-02 MED ORDER — FLUCONAZOLE 150 MG PO TABS: 150.0000 mg | ORAL_TABLET | Freq: Once | ORAL | 0 refills | Status: AC

## 2023-02-02 NOTE — Progress Notes (Signed)
Called patient and discussed lab results.  Also discussed urinalysis with protein and blood, and that we need to repeat this. Sent in Diflucan for patient today complaining of her metronidazole prescription should she develop symptoms of yeast infection which she says she typically gets after taking antibiotics.

## 2023-02-26 ENCOUNTER — Ambulatory Visit: Payer: MEDICAID

## 2023-04-29 ENCOUNTER — Ambulatory Visit: Payer: MEDICAID

## 2023-06-09 ENCOUNTER — Ambulatory Visit: Payer: MEDICAID

## 2024-03-07 ENCOUNTER — Ambulatory Visit: Payer: Self-pay

## 2024-03-07 NOTE — Telephone Encounter (Signed)
 FYI Only or Action Required?: FYI only for provider.  Patient was last seen in primary care on 01/28/2023 by Ruth Gardner, [name], DO.  Called Nurse Triage reporting Knee Pain, Numbness, and New Patient (Initial Visit).  Symptoms began about a month ago.  Interventions attempted: Nothing.  Symptoms are: gradually worsening.  Triage Disposition: See HCP Within 4 Hours (Or PCP Triage)  Patient/caregiver understands and will follow disposition?: Unsure        Copied from CRM #[id number]. Topic: Clinical - Red Word Triage >> Mar 07, 2024  3:55 PM Myrick T wrote: Red Word that prompted transfer to Nurse Triage: patient called stated left knee pain swollen and experiencing left foot numbness that is going up into her back on the left side and also numbness in her left arm. She has been experiencing this pain for months. Reason for Disposition  [1] Thigh or calf pain AND [2] only 1 side AND [3] present > 1 hour    Pt calling to establish care with EMERSON Bohr, but experiencing pain/symptoms of swelling of L knee/leg/side, including calf. Triager advised pt that there is no access any time soon with requested provider and offered to schedule [facility]. Pt stated, That's OK, yall have a good day and disconnected call.  Answer Assessment - Initial Assessment Questions 1. LOCATION and RADIATION: Where is the pain located?      L knee swelling pain -- endorses arthritis  2. QUALITY: What does the pain feel like?  (e.g., sharp, dull, aching, burning)     Aching/throbbing/numbness 3. SEVERITY: How bad is the pain? What does it keep you from doing?   (Scale 1-10; or mild, moderate, severe)     *No Answer* 4. ONSET: When did the pain start? Does it come and go, or is it there all the time?     X 1 month 5. RECURRENT: Have you had this pain before? If Yes, ask: When, and what happened then?     First time 6. SETTING: Has there been any recent work, exercise or other activity that  involved that part of the body?      denies 7. AGGRAVATING FACTORS: What makes the knee pain worse? (e.g., walking, climbing stairs, running)     denies 8. ASSOCIATED SYMPTOMS: Is there any swelling or redness of the knee?     swelling 9. OTHER SYMPTOMS: Do you have any other symptoms? (e.g., calf pain, chest pain, difficulty breathing, fever)     Swelling size of grapefruit 10. PREGNANCY: Is there any chance you are pregnant? When was your last menstrual period?       N/a  Protocols used: Knee Pain-A-AH

## 2024-03-09 ENCOUNTER — Encounter: Payer: MEDICAID | Admitting: Family Medicine

## 2024-05-20 ENCOUNTER — Ambulatory Visit: Payer: MEDICAID | Admitting: Family Medicine

## 2024-05-20 VITALS — BP 121/70 | HR 83 | Ht 60.0 in | Wt 180.4 lb

## 2024-05-20 DIAGNOSIS — Z113 Encounter for screening for infections with a predominantly sexual mode of transmission: Secondary | ICD-10-CM

## 2024-05-20 DIAGNOSIS — Z1211 Encounter for screening for malignant neoplasm of colon: Secondary | ICD-10-CM

## 2024-05-20 DIAGNOSIS — R300 Dysuria: Secondary | ICD-10-CM

## 2024-05-20 DIAGNOSIS — Z0000 Encounter for general adult medical examination without abnormal findings: Secondary | ICD-10-CM

## 2024-05-20 DIAGNOSIS — Z1231 Encounter for screening mammogram for malignant neoplasm of breast: Secondary | ICD-10-CM

## 2024-05-20 LAB — POCT URINALYSIS DIP (MANUAL ENTRY)
Bilirubin, UA: NEGATIVE
Glucose, UA: NEGATIVE mg/dL
Ketones, POC UA: NEGATIVE mg/dL
Leukocytes, UA: NEGATIVE
Nitrite, UA: NEGATIVE
Protein Ur, POC: >=300 mg/dL — AB
Spec Grav, UA: >=1.03 — AB (ref 1.010–1.025)
Urobilinogen, UA: 0.2 U/dL
pH, UA: 5 (ref 5.0–8.0)

## 2024-05-20 LAB — POCT UA - MICROSCOPIC ONLY: WBC, Ur, HPF, POC: NONE SEEN (ref 0–5)

## 2024-05-20 LAB — POCT GLYCOSYLATED HEMOGLOBIN (HGB A1C): Hemoglobin A1C: 5.4 % (ref 4.0–5.6)

## 2024-05-20 MED ORDER — FLUCONAZOLE 150 MG PO TABS: 150.0000 mg | ORAL_TABLET | Freq: Once | ORAL | 0 refills | Status: AC

## 2024-05-20 NOTE — Progress Notes (Signed)
° ° °  SUBJECTIVE:   Chief compliant/HPI: annual examination  Ruth Gardner is a 63 y.o. who presents today for an annual exam.   Dysuria Ongoing the past week. Burning with urination. Denies hematuria and abdominal pain. Denies fever and N/V. Concern for STI given exposure about a month ago had new partner, would like to be tested.  History tabs reviewed and updated.  OBJECTIVE:   BP 121/70   Pulse 83   Ht 5' (1.524 m)   Wt 180 lb 6.4 oz (81.8 kg)   LMP 10/14/2014   SpO2 96%   BMI 35.23 kg/m   General: Well-appearing. Alert. NAD HEENT: Normocephalic. White sclera. TM clear bilaterally. No rhinorrhea or congestion CV: RRR without murmur Pulm: CTAB. Normal WOB on RA. No wheezing Abdomen: Soft, non-tender, non-distended. +BS Ext: Well perfused. Cap refill < 3 seconds Skin: Warm, dry. No rashes noted  Neuro: Ambulates slowly with rolling walker  ASSESSMENT/PLAN:   Assessment & Plan Dysuria UA positive for moderate blood but microscopy with epithelial cells present, likely dirty sample.  Some yeast present on urine sample, will consider treatment for concomitant vaginal yeast infection. -Fluconazole  150 mg x 1 - Follow-up in 1 to 2 weeks of persistent symptoms to obtain wet prep  Annual Examination  See AVS for age appropriate recommendations  PHQ score - seen by [facility], reviewed and discussed.  BP reviewed and at goal.  Asked about intimate partner violence and resources given as appropriate  Advance directives discussion: Given a packet  Considered the following items based upon USPSTF recommendations: Diabetes screening: ordered HIV testing:ordered Hepatitis C: ordered Hepatitis B:ordered Syphilis if at high risk: ordered GC/CT at high risk, ordered.  Lipid panel (nonfasting or fasting) discussed based upon AHA recommendations and ordered.  Consider repeat every 4-6 years.  Reviewed risk factors for latent tuberculosis and not indicated Osteoporosis screening  considered based upon risk of fracture from [facility] calculator. Major osteoporotic fracture risk is 2.8%. DEXA not ordered.   Cancer Screening Discussion  Cervical cancer screening: prior Pap reviewed, repeat due in 2028 Breast cancer screening: ordered Discussed family history, BRCA testing not indicated.  Lung cancer screening:not indicated as does not meet criteria.  See documentation below regarding indications/risks/benefits.  Colorectal cancer screening: discussed, colonoscopy ordered.  Vaccinations:  Follow up in 1 year or sooner if indicated.  MyChart Activation: Declined  Izetta Nap, MD [facility]

## 2024-05-20 NOTE — Patient Instructions (Signed)
 It was wonderful to see you today! Thank you for choosing [facility].   Please bring ALL of your medications with you to every visit.   Today we talked about:  We are getting blood work today to check you for STIs and do general monitoring of your health.  I also placed the referral for the GI doctor and our office will contact you about getting the colonoscopy set up.  Please also complete the advance directive packet and you can bring it back to our clinic at your convenience. I will check your urine today to see if there is any sign of an infection, if you need antibiotics I will call you and follow-up.  You are due for a mammogram to screen for breast cancer.  I ordered the mammogram, please call the Breast Center using the information provided below to schedule:  [facility] [address] [place],  [place]  [address] [phone]   Please follow up in 6 months  If you haven't already, sign up for My Chart to have easy access to your labs results, and communication with your primary care physician.   We are checking some labs today. If they are abnormal, I will call you. If they are normal, I will send you a MyChart message (if it is active) or a letter in the mail. If you do not hear about your labs in the next 2 weeks, please call the office.  Call the clinic at [phone] if your symptoms worsen or you have any concerns.  Please be sure to schedule follow up at the front desk before you leave today.   Izetta Nap, DO Family Medicine

## 2024-05-21 LAB — BASIC METABOLIC PANEL WITH GFR
BUN/Creatinine Ratio: 12 (ref 12–28)
BUN: 11 mg/dL (ref 8–27)
CO2: 22 mmol/L (ref 20–29)
Calcium: 9.3 mg/dL (ref 8.7–10.3)
Chloride: 105 mmol/L (ref 96–106)
Creatinine, Ser: 0.89 mg/dL (ref 0.57–1.00)
Glucose: 93 mg/dL (ref 70–99)
Potassium: 3.9 mmol/L (ref 3.5–5.2)
Sodium: 140 mmol/L (ref 134–144)
eGFR: 73 mL/min/1.73 (ref >59)

## 2024-05-21 LAB — LIPID PANEL
Chol/HDL Ratio: 5.6 ratio — ABNORMAL HIGH (ref 0.0–4.4)
Cholesterol, Total: 224 mg/dL — ABNORMAL HIGH (ref 100–199)
HDL: 40 mg/dL (ref >39)
LDL Chol Calc (NIH): 136 mg/dL — ABNORMAL HIGH (ref 0–99)
Triglycerides: 266 mg/dL — ABNORMAL HIGH (ref 0–149)
VLDL Cholesterol Cal: 48 mg/dL — ABNORMAL HIGH (ref 5–40)

## 2024-05-21 LAB — SYPHILIS: RPR W/REFLEX TO RPR TITER AND TREPONEMAL ANTIBODIES, TRADITIONAL SCREENING AND DIAGNOSIS ALGORITHM: RPR Ser Ql: NONREACTIVE

## 2024-05-21 LAB — HIV ANTIBODY (ROUTINE TESTING W REFLEX): HIV Screen 4th Generation wRfx: NONREACTIVE

## 2024-05-21 LAB — HEPATITIS C ANTIBODY: Hep C Virus Ab: NONREACTIVE

## 2024-05-21 LAB — HEPATITIS B SURFACE ANTIGEN: Hepatitis B Surface Ag: NEGATIVE

## 2024-05-23 ENCOUNTER — Ambulatory Visit: Payer: Self-pay | Admitting: Family Medicine

## 2024-05-26 ENCOUNTER — Ambulatory Visit: Payer: MEDICAID | Admitting: Family Medicine

## 2024-05-26 ENCOUNTER — Encounter: Payer: Self-pay | Admitting: Family Medicine

## 2024-05-26 VITALS — Ht 60.0 in

## 2024-05-26 DIAGNOSIS — Z0289 Encounter for other administrative examinations: Secondary | ICD-10-CM

## 2024-05-26 NOTE — Progress Notes (Signed)
 Patient presented for completion of form for PCP. Left prior to being examined.

## 2024-05-30 ENCOUNTER — Telehealth: Payer: Self-pay | Admitting: Family Medicine

## 2024-05-30 NOTE — Telephone Encounter (Signed)
 Received reasonable accommodation form to physical inbox, appears to be dropped off during prior scheduled visit in which patient was not seen.  Form completed and will send to RN team to contact patient to complete her portion of the form and copy for patient chart.

## 2024-05-31 NOTE — Telephone Encounter (Signed)
 Called patient.  She is requesting the forms be mailed to her.   Confirmed address. Will place in the mail today.   Copy made for batch scanning and a copy placed up front as well.

## 2024-06-23 ENCOUNTER — Ambulatory Visit: Payer: MEDICAID

## 2024-07-12 ENCOUNTER — Encounter: Payer: Self-pay | Admitting: Gastroenterology
